# Patient Record
Sex: Male | Born: 1950 | Race: White | Hispanic: Yes | Marital: Married | Smoking: Former smoker
Health system: Southern US, Community
[De-identification: ages and names within clinical notes are randomized; demographics above are authoritative.]

## PROBLEM LIST (undated history)

## (undated) DIAGNOSIS — H269 Unspecified cataract: Secondary | ICD-10-CM

## (undated) DIAGNOSIS — G8929 Other chronic pain: Secondary | ICD-10-CM

## (undated) DIAGNOSIS — N471 Phimosis: Secondary | ICD-10-CM

## (undated) DIAGNOSIS — M549 Dorsalgia, unspecified: Secondary | ICD-10-CM

## (undated) DIAGNOSIS — E785 Hyperlipidemia, unspecified: Secondary | ICD-10-CM

## (undated) DIAGNOSIS — F32A Depression, unspecified: Secondary | ICD-10-CM

## (undated) DIAGNOSIS — M47816 Spondylosis without myelopathy or radiculopathy, lumbar region: Secondary | ICD-10-CM

## (undated) DIAGNOSIS — G43909 Migraine, unspecified, not intractable, without status migrainosus: Secondary | ICD-10-CM

## (undated) DIAGNOSIS — M961 Postlaminectomy syndrome, not elsewhere classified: Secondary | ICD-10-CM

## (undated) DIAGNOSIS — F329 Major depressive disorder, single episode, unspecified: Secondary | ICD-10-CM

## (undated) DIAGNOSIS — F419 Anxiety disorder, unspecified: Secondary | ICD-10-CM

## (undated) DIAGNOSIS — E349 Endocrine disorder, unspecified: Secondary | ICD-10-CM

## (undated) HISTORY — DX: Anxiety disorder, unspecified: F41.9

## (undated) HISTORY — DX: Unspecified cataract: H26.9

## (undated) HISTORY — DX: Depression, unspecified: F32.A

## (undated) HISTORY — DX: Postlaminectomy syndrome, not elsewhere classified: M96.1

---
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
## 1985-10-04 HISTORY — PX: LAMINECTOMY: SHX219

## 2010-01-05 DIAGNOSIS — R5381 Other malaise: Secondary | ICD-10-CM | POA: Insufficient documentation

## 2010-05-08 DIAGNOSIS — G43909 Migraine, unspecified, not intractable, without status migrainosus: Secondary | ICD-10-CM | POA: Insufficient documentation

## 2010-07-17 ENCOUNTER — Ambulatory Visit (HOSPITAL_COMMUNITY): Admission: RE | Admit: 2010-07-17 | Discharge: 2010-07-18 | Payer: Self-pay | Admitting: Surgery

## 2010-07-17 HISTORY — PX: OTHER SURGICAL HISTORY: SHX169

## 2010-11-30 DIAGNOSIS — F528 Other sexual dysfunction not due to a substance or known physiological condition: Secondary | ICD-10-CM | POA: Insufficient documentation

## 2010-11-30 DIAGNOSIS — F411 Generalized anxiety disorder: Secondary | ICD-10-CM | POA: Insufficient documentation

## 2010-11-30 DIAGNOSIS — M549 Dorsalgia, unspecified: Secondary | ICD-10-CM | POA: Insufficient documentation

## 2010-12-17 LAB — COMPREHENSIVE METABOLIC PANEL
ALT: 30 U/L (ref 0–53)
AST: 23 U/L (ref 0–37)
Albumin: 3.8 g/dL (ref 3.5–5.2)
Alkaline Phosphatase: 96 U/L (ref 39–117)
BUN: 11 mg/dL (ref 6–23)
CO2: 29 mEq/L (ref 19–32)
Calcium: 9.5 mg/dL (ref 8.4–10.5)
Chloride: 106 mEq/L (ref 96–112)
Creatinine, Ser: 0.93 mg/dL (ref 0.4–1.5)
GFR calc Af Amer: >60 mL/min (ref >60)
GFR calc non Af Amer: >60 mL/min (ref >60)
Glucose, Bld: 98 mg/dL (ref 70–99)
Potassium: 4.5 mEq/L (ref 3.5–5.1)
Sodium: 141 mEq/L (ref 135–145)
Total Bilirubin: 0.5 mg/dL (ref 0.3–1.2)
Total Protein: 7 g/dL (ref 6.0–8.3)

## 2010-12-17 LAB — CBC
HCT: 39 % (ref 39.0–52.0)
Hemoglobin: 12.3 g/dL — ABNORMAL LOW (ref 13.0–17.0)
MCH: 19.4 pg — ABNORMAL LOW (ref 26.0–34.0)
MCHC: 31.4 g/dL (ref 30.0–36.0)
MCV: 61.8 fL — ABNORMAL LOW (ref 78.0–100.0)
Platelets: 195 10*3/uL (ref 150–400)
RBC: 6.3 MIL/uL — ABNORMAL HIGH (ref 4.22–5.81)
RDW: 17.1 % — ABNORMAL HIGH (ref 11.5–15.5)

## 2010-12-17 LAB — PROTIME-INR
INR: 0.93 (ref 0.00–1.49)
Prothrombin Time: 12.7 seconds (ref 11.6–15.2)

## 2010-12-17 LAB — SURGICAL PCR SCREEN
MRSA, PCR: NEGATIVE
Staphylococcus aureus: POSITIVE — AB

## 2011-08-03 ENCOUNTER — Ambulatory Visit (HOSPITAL_BASED_OUTPATIENT_CLINIC_OR_DEPARTMENT_OTHER): Payer: BC Managed Care – PPO | Admitting: Physical Medicine & Rehabilitation

## 2011-08-03 ENCOUNTER — Encounter: Payer: BC Managed Care – PPO | Attending: Physical Medicine & Rehabilitation

## 2011-08-03 DIAGNOSIS — M545 Low back pain, unspecified: Secondary | ICD-10-CM | POA: Insufficient documentation

## 2011-08-03 DIAGNOSIS — M961 Postlaminectomy syndrome, not elsewhere classified: Secondary | ICD-10-CM | POA: Insufficient documentation

## 2011-08-03 NOTE — Consult Note (Addendum)
REASON FOR VISIT:  Chronic low back pain left greater than right side.  REASON FOR CONSULTATION:  As noted above.  CHIEF COMPLAINT:  As noted above.  HISTORY:  A 60 year old male who had radiculopathy involving the left lower extremity in 1987.  His left leg went numb, he had weakness in his left calf.  He underwent surgical resection and did well until about 3 years later when he developed increasing low back pain.  He had further workup, but no further surgery was deemed necessary.  He had corticosteroid pills.  He really did not have any injections.  He was started on tramadol and over the years has been increased to 50 mg 2 p.o. q.i.d.  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  He walks 3 mile as per day 3 days a week, and also plays golf once a week.  SOCIAL HISTORY:  Married, lives with his wife, drinks alcohol 2-3 drinks per week.  FAMILY HISTORY:  Noncontributory.  OTHER MEDICATIONS: 1. Lipitor 10 mg a day. 2. AndroGel 4 pumps daily.  PHYSICAL EXAMINATION:  VITAL SIGNS:  Blood pressure 159/83, pulse 12, respirations 14, O2 saturation 97% on room air. GENERAL:  No acute distress.  Mood and affect appropriate. MUSCULOSKELETAL:  His upper extremity strength is normal.  Lower extremity strength is normal.  He has decreased sensation in the left S1 greater than left L5 dermatomal distribution.  He has an absent left S1 reflex.  Otherwise, normal in the lower extremities.  His lumbar spine range of motion is normal.  Straight leg raising test is negative.  Gait is normal.  IMPRESSION: 1. Lumbar post laminectomy syndrome, L5-S1 laminectomy or diskectomy     not really shore, remote history no records from that time.  We     will get an x-ray to see exactly what procedure he had we could     also look at some adjacent level, disk space narrowing if present. 2. Pain management.  I agree that the tramadol 50 mg 2 p.o. q.i.d.     would be our best  bet in this regard.  There are other longer     release options but they only go to 300 mg once a day 3. In terms of exercise for his back pain, I have given him a fit back     workouts which would include some stretching and core strengthening     exercises.  I encouraged him to do these on a daily basis. 4. I will see him back in 6 months or sooner if he has any other type     of complaints. 5. Discussed with the patient and agrees with plan.     Erick Colace, M.D.    AEK/MedQ D:08/03/2011 10:10:50  T:08/03/2011 11:22:43  Job #:  [id number]  Electronically Signed by Claudette Laws M.D. on 08/03/2011 12:34:50 PM REASON FOR VISIT:  Chronic low back pain left greater than right side.  REASON FOR CONSULTATION:  As noted above.  CHIEF COMPLAINT:  As noted above.  HISTORY:  A 60 year old male who had radiculopathy involving the left lower extremity in 1987.  His left leg went numb, he had weakness in his left calf.  He underwent surgical resection and did well until about 3 years later when he developed increasing low back pain.  He had further workup, but no further surgery was deemed necessary.  He had corticosteroid pills.  He really did not have  any injections.  He was started on tramadol and over the years has been increased to 50 mg 2 p.o. q.i.d.  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  He walks 3 mile as per day 3 days a week, and also plays golf once a week.  SOCIAL HISTORY:  Married, lives with his wife, drinks alcohol 2-3 drinks per week.  FAMILY HISTORY:  Noncontributory.  OTHER MEDICATIONS: 1. Lipitor 10 mg a day. 2. AndroGel 4 pumps daily.  PHYSICAL EXAMINATION:  VITAL SIGNS:  Blood pressure 159/83, pulse 12, respirations 14, O2 saturation 97% on room air. GENERAL:  No acute distress.  Mood and affect appropriate. MUSCULOSKELETAL:  His upper extremity strength is normal.  Lower extremity strength is normal.  He has decreased  sensation in the left S1 greater than left L5 dermatomal distribution.  He has an absent left S1 reflex.  Otherwise, normal in the lower extremities.  His lumbar spine range of motion is normal.  Straight leg raising test is negative.  Gait is normal.  IMPRESSION: 1. Lumbar post laminectomy syndrome, L5-S1 laminectomy or diskectomy     not really shore, remote history no records from that time.  We     will get an x-ray to see exactly what procedure he had we could     also look at some adjacent level, disk space narrowing if present. 2. Pain management.  I agree that the tramadol 50 mg 2 p.o. q.i.d.     would be our best bet in this regard.  There are other longer     release options but they only go to 300 mg once a day 3. In terms of exercise for his back pain, I have given him a fit back     workouts which would include some stretching and core strengthening     exercises.  I encouraged him to do these on a daily basis. 4. I will see him back in 6 months or sooner if he has any other type     of complaints. 5. Discussed with the patient and agrees with plan.     Erick Colace, M.D. Electronically Signed    AEK/MedQ D:08/03/2011 10:10:50  T:08/03/2011 11:22:43  Job #:  [id number]

## 2011-09-30 DIAGNOSIS — E785 Hyperlipidemia, unspecified: Secondary | ICD-10-CM | POA: Insufficient documentation

## 2011-09-30 DIAGNOSIS — E349 Endocrine disorder, unspecified: Secondary | ICD-10-CM | POA: Insufficient documentation

## 2011-12-02 ENCOUNTER — Telehealth: Payer: Self-pay | Admitting: Physical Medicine & Rehabilitation

## 2011-12-02 NOTE — Telephone Encounter (Signed)
Patient would like to speak to Dr Wynn Banker personally about his healthcare.

## 2011-12-28 ENCOUNTER — Ambulatory Visit (HOSPITAL_COMMUNITY)
Admission: RE | Admit: 2011-12-28 | Discharge: 2011-12-28 | Disposition: A | Discharge status: Home / Self Care | Payer: BC Managed Care – PPO | Source: Ambulatory Visit | Attending: Physical Medicine & Rehabilitation | Admitting: Physical Medicine & Rehabilitation

## 2011-12-28 ENCOUNTER — Other Ambulatory Visit: Payer: Self-pay | Admitting: Physical Medicine & Rehabilitation

## 2011-12-28 DIAGNOSIS — M51379 Other intervertebral disc degeneration, lumbosacral region without mention of lumbar back pain or lower extremity pain: Secondary | ICD-10-CM | POA: Insufficient documentation

## 2011-12-28 DIAGNOSIS — M545 Low back pain: Secondary | ICD-10-CM

## 2011-12-28 DIAGNOSIS — M5137 Other intervertebral disc degeneration, lumbosacral region: Secondary | ICD-10-CM | POA: Insufficient documentation

## 2011-12-30 ENCOUNTER — Ambulatory Visit: Payer: BC Managed Care – PPO | Admitting: Physical Medicine & Rehabilitation

## 2012-01-03 ENCOUNTER — Ambulatory Visit: Payer: BC Managed Care – PPO

## 2012-01-03 ENCOUNTER — Encounter: Payer: BC Managed Care – PPO | Attending: Physical Medicine & Rehabilitation

## 2012-01-03 ENCOUNTER — Other Ambulatory Visit: Payer: Self-pay | Admitting: *Deleted

## 2012-01-03 ENCOUNTER — Encounter: Payer: Self-pay | Admitting: Physical Medicine & Rehabilitation

## 2012-01-03 ENCOUNTER — Ambulatory Visit (HOSPITAL_BASED_OUTPATIENT_CLINIC_OR_DEPARTMENT_OTHER): Payer: BC Managed Care – PPO | Admitting: Physical Medicine & Rehabilitation

## 2012-01-03 VITALS — BP 161/89 | HR 110 | Resp 16 | Ht 73.0 in | Wt 236.0 lb

## 2012-01-03 DIAGNOSIS — M47817 Spondylosis without myelopathy or radiculopathy, lumbosacral region: Secondary | ICD-10-CM

## 2012-01-03 DIAGNOSIS — M47816 Spondylosis without myelopathy or radiculopathy, lumbar region: Secondary | ICD-10-CM

## 2012-01-03 DIAGNOSIS — M961 Postlaminectomy syndrome, not elsewhere classified: Secondary | ICD-10-CM | POA: Insufficient documentation

## 2012-01-03 NOTE — Progress Notes (Signed)
  Subjective:    Patient ID: Zachary Everett, male    DOB: 20-Apr-1951, 61 y.o.   MRN: [id number]  HPI  No new problems today. Primarily back pain which worsens after he plays golf or stands in one place for long per time. We checked x-rays and I reviewed the results with the patient. He has a history of lumbar discectomy and at L5-S1 has a markedly narrowed disc space. There is no evidence of instrumentation. At L4-L5 there is a 3 mm anterolisthesis and some associated facet degenerative changes at that same level. Patient has had some type of back injections in the past which were not very helpful he does not remember what type they were however. He's not interested in any surgery at this time. Pain is well relieved by tramadol Pain Inventory Average Pain 3 Pain Right Now 3 My pain is sharp  In the last 24 hours, has pain interfered with the fo llowing? General activity 5 Relation with others 5 Enjoyment of life 6 What TIME of day is your pain at its worst? morning Sleep (in general) Poor  Pain is worse with: lifting Pain improves with: medication Relief from Meds: 9  Mobility how many minutes can you walk? 40 ability to climb steps?  yes do you drive?  yes Do you have any goals in this area?  yes  Function employed # of hrs/week 55  Neuro/Psych numbness  Prior Studies Any changes since last visit?  no  Physicians involved in your care Any changes since last visit?  no  Review of Systems  Constitutional: Negative.   HENT: Negative.   Eyes: Negative.   Respiratory: Negative.   Cardiovascular: Negative.   Gastrointestinal: Negative.   Genitourinary: Negative.   Musculoskeletal: Negative.   Skin: Negative.   Neurological: Positive for numbness.  Hematological: Negative.   Psychiatric/Behavioral: Negative.        Objective:   Physical Exam  Constitutional: He is oriented to person, place, and time. He appears well-developed and well-nourished.    Musculoskeletal:       Lumbar back: Normal.  Neurological: He is alert and oriented to person, place, and time. He has normal strength. No sensory deficit.  Reflex Scores:      Tricep reflexes are 2+ on the right side and 2+ on the left side.      Bicep reflexes are 2+ on the right side and 2+ on the left side.      Brachioradialis reflexes are 2+ on the right side and 2+ on the left side.      Patellar reflexes are 2+ on the right side and 2+ on the left side.      Achilles reflexes are 2+ on the right side and 2+ on the left side. Psychiatric: He has a normal mood and affect. His behavior is normal. Judgment and thought content normal.          Assessment & Plan:  1. 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 He has responded quite well to tramadol and has not had any progression in his pain. We did discuss his x-ray findings in great detail including looking at the films as well as looking at a spine model. 2. Lumbar spondylosis without evidence of myelopathy. Should his pain progressed I would favor trying facet injections. He has tried physical therapy in the past she is not interested in repeating at this point.

## 2012-01-03 NOTE — Patient Instructions (Signed)
Facet Block A facet block is an injection procedure used to numb nerves near a spinal joint (facet). The injection usually includes a medicine like Novacaine (anesthetic) and a steroid medicine (similar to cortisone). The injections are made directly into the facet joint of the back. They are used for patients with several types of neck or back pain problems (such as worsening arthritis or persistent pain after surgery) that have not been helped with anti-inflammatory medications, exercise programs, physical therapy, and other forms of pain management. Multiple injections may be needed depending on how many joints are involved.  A facet block procedure can be helpful with diagnosis as well as providing therapeutic pain relief. One of three things may happen after the procedure:  The pain does not go away. This can mean that the pain is probably not coming from blocked facet joints. This information is helpful with diagnosis.   The pain goes away and stays away for a few hours but the original pain comes back and does not get better again. This information is also helpful with diagnosis. It can mean that pain is probably coming from the joints; but the steroid was not helpful for longer term pain control.   The pain goes away after the block, then returns later that day, and then gets better again over the next few days. This can mean that the block was helpful for pain control and the steroid had a longer lasting effect.  If there is good, lasting benefit from the injections, the block may be repeated from 3 to 5 times. If there is good relief but it is only of short-term benefit, other procedures (such as radiofrequency lesioning) may be considered.  Note: The procedure cannot be performed if you have an active infection, a lesion on or near the area of injection, flu, cold, fever, very high blood pressure or if you are on blood thinners. Please make your doctor aware of any of these conditions. This is  for your safety!  LET YOUR CAREGIVER KNOW ABOUT:   Allergies.   Medications taken including herbs, eye drops, over the counter medications, and creams   Use of steroids (by mouth or creams).   Possible pregnancy, if applicable.   Previous problems with anesthetics or Novocaine.   History of blood clots.   History of bleeding or blood problems.   Previous surgery, particularly of the neck and/or back   Other health problems.  RISKS AND COMPLICATIONS These are very uncommon but include:  Bleeding.   Injury to a nerve near the injection site.   Weakness or numbness in areas controlled by nerves near the injection site.   Infection.   Pain at the site of the injection.   Temporary fluid retention in those who are prone to this problem.   Allergic reaction to anesthetics or medicines used during the procedure.  Diabetics may have a temporary increase in their blood sugar after any surgical procedure, especially if steroids are used. Stinging/burning of the numbing medicine is the most uncomfortable part of the procedure; however every person's response to any procedure is individual.  BEFORE THE PROCEDURE   Your caregiver will provide instructions about stopping any medication before the procedure.   Unless advised otherwise, if the injections are in your neck, you may take your medications as usual with a sip of water but do not eat or drink for 6 hours before the procedure.   Unless advised otherwise, you may eat, drink and take your medications as   usual on the day of the procedure (both before and after) if the injections are to be in your lower back.   There is no other specific preparation necessary unless advised otherwise.  PROCEDURE After checking your blood pressure, the procedure will be done in the x-ray (fluoroscopy) room while lying on your stomach. For procedures in the neck, an intravenous line is usually started. The back is then cleansed with an antiseptic  soap. Sterile drapes are placed in this area. The skin is numbed with a local anesthetic. This is felt as a stinging or burning sensation. Using x-ray guidance, needles are then advanced to the appropriate locations. Once the needles are in the proper location, the anesthetic and steroid is injected through the needles and the needles are removed. The skin is then cleansed and bandages are applied. Blood pressure will be checked again, and you will be discharged to leave with your ride after your caregiver says it is okay to go.  AFTER THE PROCEDURE  You may not drive for the remainder of the day after your procedure. An adult must be present to drive you home or to go with you in a taxi or on public transportation. The procedure will be canceled if you do not have a responsible adult with you! This is for your safety.  HOME CARE INSTRUCTIONS   The bandages noted above can be removed on the morning after the procedure.   Resume medications according to your caregiver's instructions.   No heat is to be used near or over the injected area(s) for the remainder of the day.   No tub bath or soaking in water (such as a pool, jacuzzi, etc.) for the remainder of the day.   Some local tenderness may be experienced for a couple of days after the injection. Using an ice pack three or four times a day will help this.   Keep track of the amount of pain relief as well as how long the pain relief lasted.  SEEK MEDICAL CARE IF:   There is drainage from the injection site.   Pain is not controlled with medications prescribed.   There is significant bleeding or swelling.  SEEK IMMEDIATE MEDICAL CARE IF:   You develop a fever of 101 F (38.3 C) or greater.   Worsening pain, swelling, and/or red streaking develops in the skin around the injection site.   Severe pain develops and cannot be controlled with medications prescribed.   You develop any headache, stiff neck, nausea, vomiting, or your eyes become  very sensitive to light.   Weakness or paralysis develops in arms or legs not present before the procedure.   You develop difficulty urinating or difficulty breathing.  Document Released: 02/09/2007 Document Revised: 09/09/2011 Document Reviewed: 01/30/2009 [employer] Patient Information 2012 [employer]. 

## 2012-01-04 ENCOUNTER — Encounter: Payer: Self-pay | Admitting: Physical Medicine & Rehabilitation

## 2012-01-17 ENCOUNTER — Other Ambulatory Visit: Payer: Self-pay | Admitting: *Deleted

## 2012-01-17 MED ORDER — TRAMADOL HCL 50 MG PO TABS: 100.0000 mg | ORAL_TABLET | Freq: Four times a day (QID) | ORAL | Status: DC

## 2012-02-23 ENCOUNTER — Other Ambulatory Visit: Payer: Self-pay | Admitting: Urology

## 2012-02-24 ENCOUNTER — Encounter (HOSPITAL_BASED_OUTPATIENT_CLINIC_OR_DEPARTMENT_OTHER): Payer: Self-pay | Admitting: *Deleted

## 2012-02-24 DIAGNOSIS — IMO0001 Reserved for inherently not codable concepts without codable children: Secondary | ICD-10-CM | POA: Insufficient documentation

## 2012-02-24 DIAGNOSIS — N471 Phimosis: Secondary | ICD-10-CM

## 2012-02-24 NOTE — Anesthesia Preprocedure Evaluation (Addendum)
Anesthesia Evaluation  Patient identified by MRN, date of birth, ID band Patient awake    Reviewed: Allergy & Precautions, H&P , NPO status , Patient's Chart, lab work & pertinent test results  Airway Mallampati: II TM Distance: >3 FB Neck ROM: Full    Dental No notable dental hx.    Pulmonary neg pulmonary ROS, former smoker breath sounds clear to auscultation  Pulmonary exam normal       Cardiovascular Exercise Tolerance: Good negative cardio ROS  Rhythm:Regular Rate:Normal     Neuro/Psych  Headaches, Migraine now. negative psych ROS   GI/Hepatic negative GI ROS, Neg liver ROS,   Endo/Other  negative endocrine ROS  Renal/GU negative Renal ROS  negative genitourinary   Musculoskeletal negative musculoskeletal ROS (+)   Abdominal (+) + obese,   Peds negative pediatric ROS (+)  Hematology negative hematology ROS (+)   Anesthesia Other Findings   Reproductive/Obstetrics negative OB ROS                         Anesthesia Physical Anesthesia Plan  ASA: I  Anesthesia Plan: General   Post-op Pain Management:    Induction: Intravenous  Airway Management Planned: LMA  Additional Equipment:   Intra-op Plan:   Post-operative Plan: Extubation in OR  Informed Consent: I have reviewed the patients History and Physical, chart, labs and discussed the procedure including the risks, benefits and alternatives for the proposed anesthesia with the patient or authorized representative who has indicated his/her understanding and acceptance.   Dental advisory given  Plan Discussed with: CRNA  Anesthesia Plan Comments:         Anesthesia Quick Evaluation

## 2012-02-24 NOTE — Progress Notes (Signed)
NPO AFTER MN. ARRIVES AT 0600. NEEDS CBC AND PT/INR. WILL TAKE ULTRAM AM OF SURG. W/ SIPS OF WATER.

## 2012-02-24 NOTE — H&P (Signed)
History of Present Illness      Zachary Everett is a 61 year old male patient who is self referred for phimosis. He reports this condition began about 3 or 4 weeks ago. He's never had any previous difficulty retracting his foreskin but now finds it very painful to retract the foreskin and when it is retracted it is uncomfortable and feels like there is a rubber band around the penis. He has no history of diabetes. He's not seen any discharge and has not seen any redness or inflammation. He has noted some whitish discoloration of the foreskin distally. As far as his voiding goes he does have some mild voiding symptoms consisting of nocturia and some mild hesitancy. His condition would be considered of moderate severity with no modifying factors or other associated signs and symptoms.   Past Medical History Problems  1. History of  Arthritis V13.4  Surgical History Problems  1. History of  Back Surgery 2. History of  Umbilical Hernia Repair Bilateral  Current Meds 1. AndroGel Pump 20.25 MG/ACT (1.62%) Transdermal Gel; Therapy: 27Dec2012 to 2. Lipitor 20 MG Oral Tablet; Therapy: (Recorded:22May2013) to 3. Ultram TABS; Therapy: (Recorded:22May2013) to  Allergies Medication  1. Penicillins  Family History Problems  1. Family history of  No Significant Family History  Social History Problems    Being A Social Drinker   Caffeine Use   Former Smoker V15.82 quit in 1987 1 ppdx15 years ago   Marital History - Currently Married  Review of Systems Genitourinary, constitutional, skin, eye, otolaryngeal, hematologic/lymphatic, cardiovascular, pulmonary, endocrine, musculoskeletal, gastrointestinal, neurological and psychiatric system(s) were reviewed and pertinent findings if present are noted.  Genitourinary: dysuria, nocturia, difficulty starting the urinary stream, weak urinary stream, urinary stream starts and stops and penile pain.  Musculoskeletal: back pain.  Neurological: headache.    Vitals Vital Signs BMI Calculated: 30.62 BSA Calculated: 2.28 Height: 6 ft 1 in Weight: 231 lb  Blood Pressure: 139 / 88 Heart Rate: 97  Physical Exam Constitutional: Well nourished and well developed . No acute distress.  ENT:. The ears and nose are normal in appearance.  Neck: The appearance of the neck is normal and no neck mass is present.  Pulmonary: No respiratory distress and normal respiratory rhythm and effort.  Cardiovascular: Heart rate and rhythm are normal . No peripheral edema.  Abdomen: The abdomen is soft and nontender. No masses are palpated. No CVA tenderness. No hernias are palpable. No hepatosplenomegaly noted.  Rectal: Rectal exam demonstrates normal sphincter tone, no tenderness and no masses. Estimated prostate size is 1+. The prostate has no nodularity and is not tender. The left seminal vesicle is nonpalpable. The right seminal vesicle is nonpalpable. The perineum is normal on inspection.  Genitourinary: Examination of the penis demonstrates phimosis, but no discharge, no masses, no lesions and a normal meatus. The penis is uncircumcised. The scrotum is without lesions. The right epididymis is palpably normal and non-tender. The left epididymis is palpably normal and non-tender. The right testis is non-tender and without masses. The left testis is non-tender and without masses.  Lymphatics: The femoral and inguinal nodes are not enlarged or tender.  Skin: Normal skin turgor, no visible rash and no visible skin lesions.  Neuro/Psych:. Mood and affect are appropriate.    Results/Data Urine  22May2013  COLOR YELLOW   APPEARANCE CLEAR   SPECIFIC GRAVITY 1.020   pH 6.0   GLUCOSE NEG mg/dL  BILIRUBIN NEG   KETONE TRACE mg/dL  BLOOD TRACE   PROTEIN NEG  mg/dL  UROBILINOGEN 1 mg/dL  NITRITE NEG   LEUKOCYTE ESTERASE NEG   SQUAMOUS EPITHELIAL/HPF RARE   WBC NONE SEEN WBC/hpf  RBC 0-3 RBC/hpf  BACTERIA NONE SEEN   CRYSTALS NONE SEEN   CASTS NONE SEEN     Assessment Assessed  1. Phimosis 605 2. Benign Prostatic Hypertrophy With Urinary Obstruction 600.01   He has what appears to be early BXO involving the foreskin but none involving the glans or meatus. We therefore discussed conservative therapy with topical steroids. We also discussed surgical correction with circumcision. He wanted to consider circumcision I therefore have discussed the procedure with him in detail including its risks and complications. He understands and has elected to proceed.  His prostate is smooth and symmetric with no nodularity or induration. He has not had an annual physical in 2 years and requested that I give him the name of a family practitioner saw a gave him Dr. Tenna Delaine name.   Plan    He is scheduled for a circumcision.

## 2012-02-25 ENCOUNTER — Encounter (HOSPITAL_BASED_OUTPATIENT_CLINIC_OR_DEPARTMENT_OTHER): Payer: Self-pay | Admitting: Anesthesiology

## 2012-02-25 ENCOUNTER — Ambulatory Visit (HOSPITAL_BASED_OUTPATIENT_CLINIC_OR_DEPARTMENT_OTHER): Payer: BC Managed Care – PPO | Admitting: Anesthesiology

## 2012-02-25 ENCOUNTER — Ambulatory Visit (HOSPITAL_BASED_OUTPATIENT_CLINIC_OR_DEPARTMENT_OTHER)
Admission: RE | Admit: 2012-02-25 | Discharge: 2012-02-25 | Disposition: A | Discharge status: Home / Self Care | Payer: BC Managed Care – PPO | Source: Ambulatory Visit | Attending: Urology | Admitting: Urology

## 2012-02-25 ENCOUNTER — Encounter (HOSPITAL_BASED_OUTPATIENT_CLINIC_OR_DEPARTMENT_OTHER)
Admission: RE | Disposition: A | Discharge status: Home / Self Care | Payer: Self-pay | Source: Ambulatory Visit | Attending: Urology

## 2012-02-25 ENCOUNTER — Encounter (HOSPITAL_BASED_OUTPATIENT_CLINIC_OR_DEPARTMENT_OTHER): Payer: Self-pay | Admitting: *Deleted

## 2012-02-25 DIAGNOSIS — N478 Other disorders of prepuce: Secondary | ICD-10-CM | POA: Insufficient documentation

## 2012-02-25 DIAGNOSIS — N4889 Other specified disorders of penis: Secondary | ICD-10-CM | POA: Insufficient documentation

## 2012-02-25 DIAGNOSIS — N471 Phimosis: Secondary | ICD-10-CM

## 2012-02-25 HISTORY — DX: Hyperlipidemia, unspecified: E78.5

## 2012-02-25 HISTORY — DX: Endocrine disorder, unspecified: E34.9

## 2012-02-25 HISTORY — DX: Spondylosis without myelopathy or radiculopathy, lumbar region: M47.816

## 2012-02-25 HISTORY — DX: Dorsalgia, unspecified: M54.9

## 2012-02-25 HISTORY — DX: Migraine, unspecified, not intractable, without status migrainosus: G43.909

## 2012-02-25 HISTORY — PX: CIRCUMCISION: SHX1350

## 2012-02-25 HISTORY — DX: Phimosis: N47.1

## 2012-02-25 HISTORY — DX: Other chronic pain: G89.29

## 2012-02-25 LAB — CBC
HCT: 42.1 % (ref 39.0–52.0)
Hemoglobin: 13.3 g/dL (ref 13.0–17.0)
MCHC: 31.6 g/dL (ref 30.0–36.0)
RDW: 17.4 % — ABNORMAL HIGH (ref 11.5–15.5)
WBC: 7.7 10*3/uL (ref 4.0–10.5)

## 2012-02-25 LAB — PROTIME-INR
INR: 0.91 (ref 0.00–1.49)
Prothrombin Time: 12.4 seconds (ref 11.6–15.2)

## 2012-02-25 SURGERY — CIRCUMCISION, ADULT
Anesthesia: General | Site: Penis | Wound class: Clean

## 2012-02-25 MED ORDER — CEFAZOLIN SODIUM 1-5 GM-% IV SOLN: 1.0000 g | INTRAVENOUS | Status: DC

## 2012-02-25 MED ORDER — FENTANYL CITRATE 0.05 MG/ML IJ SOLN: 25.0000 ug | INTRAMUSCULAR | Status: DC | PRN

## 2012-02-25 MED ORDER — LACTATED RINGERS IV SOLN
INTRAVENOUS | Status: DC
  Administered 2012-02-25: 100 mL/h via INTRAVENOUS

## 2012-02-25 MED ORDER — PROMETHAZINE HCL 25 MG/ML IJ SOLN: 6.2500 mg | INTRAMUSCULAR | Status: DC | PRN

## 2012-02-25 MED ORDER — MIDAZOLAM HCL 5 MG/5ML IJ SOLN
INTRAMUSCULAR | Status: DC | PRN
  Administered 2012-02-25: 2 mg via INTRAVENOUS

## 2012-02-25 MED ORDER — ONDANSETRON HCL 4 MG/2ML IJ SOLN
INTRAMUSCULAR | Status: DC | PRN
  Administered 2012-02-25: 4 mg via INTRAVENOUS

## 2012-02-25 MED ORDER — LIDOCAINE HCL (CARDIAC) 20 MG/ML IV SOLN
INTRAVENOUS | Status: DC | PRN
  Administered 2012-02-25: 80 mg via INTRAVENOUS

## 2012-02-25 MED ORDER — PROPOFOL 10 MG/ML IV EMUL
INTRAVENOUS | Status: DC | PRN
  Administered 2012-02-25: 200 mg via INTRAVENOUS

## 2012-02-25 MED ORDER — DEXAMETHASONE SODIUM PHOSPHATE 4 MG/ML IJ SOLN
INTRAMUSCULAR | Status: DC | PRN
  Administered 2012-02-25: 8 mg via INTRAVENOUS

## 2012-02-25 MED ORDER — BACITRACIN-NEOMYCIN-POLYMYXIN 400-5-5000 EX OINT
TOPICAL_OINTMENT | CUTANEOUS | Status: DC | PRN
  Administered 2012-02-25: 1 via TOPICAL

## 2012-02-25 MED ORDER — CEFAZOLIN SODIUM-DEXTROSE 2-3 GM-% IV SOLR
2.0000 g | INTRAVENOUS | Status: AC
  Administered 2012-02-25: 2 g via INTRAVENOUS

## 2012-02-25 MED ORDER — BUPIVACAINE HCL (PF) 0.25 % IJ SOLN
INTRAMUSCULAR | Status: DC | PRN
  Administered 2012-02-25: 20 mL

## 2012-02-25 MED ORDER — OXYCODONE-ACETAMINOPHEN 10-325 MG PO TABS: 1.0000 | ORAL_TABLET | ORAL | Status: AC | PRN

## 2012-02-25 MED ORDER — FENTANYL CITRATE 0.05 MG/ML IJ SOLN
INTRAMUSCULAR | Status: DC | PRN
  Administered 2012-02-25 (×2): 50 ug via INTRAVENOUS
  Administered 2012-02-25: 25 ug via INTRAVENOUS

## 2012-02-25 SURGICAL SUPPLY — 31 items
BANDAGE CO FLEX L/F 1IN X 5YD (GAUZE/BANDAGES/DRESSINGS) ×1 IMPLANT
BANDAGE CO FLEX L/F 2IN X 5YD (GAUZE/BANDAGES/DRESSINGS) IMPLANT
BANDAGE CONFORM 3  STR LF (GAUZE/BANDAGES/DRESSINGS) ×1 IMPLANT
BLADE SURG 15 STRL LF DISP TIS (BLADE) ×1 IMPLANT
BLADE SURG 15 STRL SS (BLADE) ×2
BLADE SURG ROTATE 9660 (MISCELLANEOUS) IMPLANT
BNDG COHESIVE 1X5 TAN STRL LF (GAUZE/BANDAGES/DRESSINGS) ×1 IMPLANT
CLOTH BEACON ORANGE TIMEOUT ST (SAFETY) ×2 IMPLANT
COVER MAYO STAND STRL (DRAPES) ×2 IMPLANT
COVER TABLE BACK 60X90 (DRAPES) ×2 IMPLANT
DRAPE PED LAPAROTOMY (DRAPES) ×2 IMPLANT
ELECT REM PT RETURN 9FT ADLT (ELECTROSURGICAL) ×2
ELECTRODE REM PT RTRN 9FT ADLT (ELECTROSURGICAL) ×1 IMPLANT
GLOVE BIO SURGEON STRL SZ7 (GLOVE) ×1 IMPLANT
GLOVE BIO SURGEON STRL SZ8 (GLOVE) ×2 IMPLANT
GLOVE INDICATOR 7.0 STRL GRN (GLOVE) ×4 IMPLANT
GOWN PREVENTION PLUS LG XLONG (DISPOSABLE) ×2 IMPLANT
GOWN STRL REIN XL XLG (GOWN DISPOSABLE) ×2 IMPLANT
GOWN SURGICAL LARGE (GOWNS) ×1 IMPLANT
IV NS IRRIG 3000ML ARTHROMATIC (IV SOLUTION) IMPLANT
NEEDLE HYPO 22GX1.5 SAFETY (NEEDLE) ×2 IMPLANT
NS IRRIG 500ML POUR BTL (IV SOLUTION) ×1 IMPLANT
PACK BASIN DAY SURGERY FS (CUSTOM PROCEDURE TRAY) ×2 IMPLANT
PENCIL BUTTON HOLSTER BLD 10FT (ELECTRODE) ×2 IMPLANT
SUT CHROMIC 3 0 SH 27 (SUTURE) ×4 IMPLANT
SUT CHROMIC 4 0 RB 1X27 (SUTURE) ×1 IMPLANT
SYR CONTROL 10ML LL (SYRINGE) ×2 IMPLANT
TOWEL OR 17X24 6PK STRL BLUE (TOWEL DISPOSABLE) ×4 IMPLANT
TRAY DSU PREP LF (CUSTOM PROCEDURE TRAY) ×2 IMPLANT
WATER STERILE IRR 3000ML UROMA (IV SOLUTION) IMPLANT
WATER STERILE IRR 500ML POUR (IV SOLUTION) ×1 IMPLANT

## 2012-02-25 NOTE — Transfer of Care (Signed)
Immediate Anesthesia Transfer of Care Note  Patient: Zachary Everett  Procedure(s) Performed: Procedure(s) (LRB): CIRCUMCISION ADULT (N/A)  Patient Location: PACU  Anesthesia Type: General  Level of Consciousness: awake, oriented, sedated and patient cooperative  Airway & Oxygen Therapy: Patient Spontanous Breathing and Patient connected to face mask oxygen  Post-op Assessment: Report given to PACU RN and Post -op Vital signs reviewed and stable  Post vital signs: Reviewed and stable  Complications: No apparent anesthesia complications

## 2012-02-25 NOTE — Op Note (Signed)
PATIENT:  Zachary Everett  PRE-OPERATIVE DIAGNOSIS:  1. Phimosis 2. 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 Release and repair of tethered frenulum 2. Circumcision  SURGEON:  Garnett Farm  INDICATION: Mr. Zachary Everett is a 61 year old male who is a nondiabetic. He recently began to have difficulty retracting his foreskin. It became painful to retract and then resulted in cracking and splitting of the foreskin. When he then was able to get the foreskin back over the glans he said it felt like a rubber band been placed around his penis. This has become progressively worse. 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 We discussed the treatment options and he has elected to proceed with surgical correction.  NESTHESIA:  General  EBL:  Minimal  DRAINS:  none  LOCAL MEDICATIONS USED:   1/4 percent plain Marcaine   SPECIMEN:   Foreskin  DISPOSITION OF SPECIMEN:  PATHOLOGY  Description of procedure: after informed consent the patient was taken to the major operating room and placed on the table. He was administered general anesthesia as well as intravenous antibiotics. He then underwent sterile prepping and draping. An official timeout was then performed.  A dorsal penile block was performed by injecting the Marcaine at the base of the penis in the standard fashion using 10 cc initially. At the end of the procedure I used a second 10 cc to perform a ring block at the base of the penis.  I retracted his foreskin and cleansed the glans and inner preputial skin with Betadine. I noted after retracting the foreskin very significant tethering of the frenula region. I attempted to pass a hemostat beneath the tethered band but was unsuccessful indicating that this was a tethering band as opposed to a adhesion.  I therefore incised the band somewhat proximally and this retracted up onto the glans. I then made a circumcising incision circumferentially approximately 4 mm from the glans. I then replaced the foreskin in its normal anatomic position and marked the position of his corona and incised along this marking. The redundant foreskin was then excised and bleeding points on the shaft of the penis were cauterized with electrocautery.  I first repaired the tethered frenulum by approximating the frenular skin edges that had retracted onto the glans with interrupted 4-0 chromic sutures. I then reapproximated the skin edges by placing a suture of 3-0 chromic at the 6:00 position in the shaft skin and using a U stitch at the 6:00 position and a second 3-0 chromic was placed at the 12:00 position. I then approximated the skin edges with the 3-0 chromic in a running fashion. I then applied Neosporin to the incision and folded 4 x 4's were applied gently and loosely around the penis. Coban was then used to secure this and the patient was awakened and taken to the recovery room in stable and satisfactory condition. He tolerated the procedure well no intraoperative complications. Needle sponge and instrument counts were correct.    PLAN OF CARE: Discharge to home after PACU  PATIENT DISPOSITION:  PACU - hemodynamically stable.

## 2012-02-25 NOTE — Anesthesia Postprocedure Evaluation (Signed)
  Anesthesia Post-op Note  Patient: Zachary Everett  Procedure(s) Performed: Procedure(s) (LRB): CIRCUMCISION ADULT (N/A)  Patient Location: PACU  Anesthesia Type: General  Level of Consciousness: awake and alert   Airway and Oxygen Therapy: Patient Spontanous Breathing  Post-op Pain: mild  Post-op Assessment: Post-op Vital signs reviewed, Patient's Cardiovascular Status Stable, Respiratory Function Stable, Patent Airway and No signs of Nausea or vomiting  Post-op Vital Signs: stable  Complications: No apparent anesthesia complications

## 2012-02-25 NOTE — Discharge Instructions (Addendum)
Postoperative instructions for circumcision ° °Wound: ° °In most cases your incision will have absorbable sutures that run along the course of your incision and will dissolve within the first 10-20 days. Some will fall out even earlier. Expect some redness as the sutures dissolved but this should occur only around the sutures. If there is generalized redness, especially with increasing pain or swelling, let us know. The penis will very likely get "black and blue" as the blood in the tissues spread. Sometimes the whole penis will turn colors. The black and blue is followed by a yellow and brown color. In time, all the discoloration will go away. ° °Diet: ° °You may return to your normal diet within 24 hours following your surgery. You may note some mild nausea and possibly vomiting the first 6-8 hours following surgery. This is usually due to the side effects of anesthesia, and will disappear quite soon. I would suggest clear liquids and a very light meal the first evening following your surgery. ° °Activity: ° °Your physical activity should be restricted the first 48 hours. During that time you should remain relatively inactive, moving about only when necessary. During the first 7-10 days following surgery he should avoid lifting any heavy objects (anything greater than 15 pounds), and avoid strenuous exercise. If you work, ask us specifically about your restrictions, both for work and home. We will write a note to your employer if needed. ° °Ice packs can be placed on and off over the penis for the first 48 hours to help relieve the pain and keep the swelling down. Frozen peas or corn in a ZipLock bag can be frozen, used and re-frozen. Fifteen minutes on and 15 minutes off is a reasonable schedule.  ° °Hygiene: ° °You may shower 48 hours after your surgery. Tub bathing should be restricted until the seventh day. ° °Medication: ° °You will be sent home with some type of pain medication. In many cases you will be  sent home with a narcotic pain pill (Vicodin or Tylox). If the pain is not too bad, you may take either Tylenol (acetaminophen) or Advil (ibuprofen) which contain no narcotic agents, and might be tolerated a little better, with fewer side effects. If the pain medication you are sent home with does not control the pain, you will have to let us know. Some narcotic pain medications cannot be given or refilled by a phone call to a pharmacy. ° °Problems you should report to us: ° °· Fever of 101.0 degrees Fahrenheit or greater. °· Moderate or severe swelling under the skin incision or involving the scrotum. °· Drug reaction such as hives, a rash, nausea or vomiting.  ° ° °Post Anesthesia Home Care Instructions ° °Activity: °Get plenty of rest for the remainder of the day. A responsible adult should stay with you for 24 hours following the procedure.  °For the next 24 hours, DO NOT: °-Drive a car °-Operate machinery °-Drink alcoholic beverages °-Take any medication unless instructed by your physician °-Make any legal decisions or sign important papers. ° °Meals: °Start with liquid foods such as gelatin or soup. Progress to regular foods as tolerated. Avoid greasy, spicy, heavy foods. If nausea and/or vomiting occur, drink only clear liquids until the nausea and/or vomiting subsides. Call your physician if vomiting continues. ° °Special Instructions/Symptoms: °Your throat may feel dry or sore from the anesthesia or the breathing tube placed in your throat during surgery. If this causes discomfort, gargle with warm salt water. The discomfort   should disappear within 24 hours. ° °

## 2012-02-25 NOTE — Interval H&P Note (Signed)
History and Physical Interval Note:  02/25/2012 7:13 AM  Zachary Everett  has presented today for surgery, with the diagnosis of Phimosis  The various methods of treatment have been discussed with the patient and family. After consideration of risks, benefits and other options for treatment, the patient has consented to  Procedure(s) (LRB): CIRCUMCISION ADULT (N/A) as a surgical intervention .  The patients' history has been reviewed, patient examined, no change in status, stable for surgery.  I have reviewed the patients' chart and labs.  Questions were answered to the patient's satisfaction.     Garnett Farm

## 2012-02-25 NOTE — Anesthesia Procedure Notes (Signed)
Procedure Name: LMA Insertion Date/Time: 02/25/2012 7:32 AM Performed by: Renella Cunas D Pre-anesthesia Checklist: Patient identified, Emergency Drugs available, Suction available and Patient being monitored Patient Re-evaluated:Patient Re-evaluated prior to inductionOxygen Delivery Method: Circle System Utilized Preoxygenation: Pre-oxygenation with 100% oxygen Intubation Type: IV induction Ventilation: Mask ventilation without difficulty LMA: LMA inserted LMA Size: 4.0 Number of attempts: 1 Airway Equipment and Method: bite block Placement Confirmation: positive ETCO2 Tube secured with: Tape Dental Injury: Teeth and Oropharynx as per pre-operative assessment

## 2012-02-29 ENCOUNTER — Encounter (HOSPITAL_BASED_OUTPATIENT_CLINIC_OR_DEPARTMENT_OTHER): Payer: Self-pay | Admitting: Urology

## 2012-03-17 ENCOUNTER — Other Ambulatory Visit: Payer: Self-pay | Admitting: Physical Medicine & Rehabilitation

## 2012-05-19 ENCOUNTER — Other Ambulatory Visit: Payer: Self-pay | Admitting: Physical Medicine & Rehabilitation

## 2012-07-04 ENCOUNTER — Encounter: Payer: BC Managed Care – PPO | Attending: Physical Medicine & Rehabilitation

## 2012-07-04 ENCOUNTER — Ambulatory Visit: Payer: BC Managed Care – PPO | Admitting: Physical Medicine & Rehabilitation

## 2012-07-04 DIAGNOSIS — M961 Postlaminectomy syndrome, not elsewhere classified: Secondary | ICD-10-CM | POA: Insufficient documentation

## 2012-07-04 DIAGNOSIS — M47817 Spondylosis without myelopathy or radiculopathy, lumbosacral region: Secondary | ICD-10-CM | POA: Insufficient documentation

## 2012-07-04 DIAGNOSIS — G8928 Other chronic postprocedural pain: Secondary | ICD-10-CM | POA: Insufficient documentation

## 2012-07-04 DIAGNOSIS — M7710 Lateral epicondylitis, unspecified elbow: Secondary | ICD-10-CM | POA: Insufficient documentation

## 2012-07-11 ENCOUNTER — Other Ambulatory Visit: Payer: Self-pay | Admitting: Physical Medicine & Rehabilitation

## 2012-07-11 ENCOUNTER — Encounter: Payer: Self-pay | Admitting: Physical Medicine & Rehabilitation

## 2012-07-11 ENCOUNTER — Ambulatory Visit (HOSPITAL_BASED_OUTPATIENT_CLINIC_OR_DEPARTMENT_OTHER): Payer: BC Managed Care – PPO | Admitting: Physical Medicine & Rehabilitation

## 2012-07-11 VITALS — BP 141/87 | HR 99 | Resp 14 | Ht 73.0 in | Wt 233.0 lb

## 2012-07-11 DIAGNOSIS — M7711 Lateral epicondylitis, right elbow: Secondary | ICD-10-CM

## 2012-07-11 DIAGNOSIS — M47817 Spondylosis without myelopathy or radiculopathy, lumbosacral region: Secondary | ICD-10-CM

## 2012-07-11 DIAGNOSIS — M7710 Lateral epicondylitis, unspecified elbow: Secondary | ICD-10-CM

## 2012-07-11 DIAGNOSIS — M961 Postlaminectomy syndrome, not elsewhere classified: Secondary | ICD-10-CM

## 2012-07-11 DIAGNOSIS — M47816 Spondylosis without myelopathy or radiculopathy, lumbar region: Secondary | ICD-10-CM

## 2012-07-11 MED ORDER — DICLOFENAC SODIUM 1 % TD GEL: 2.0000 g | Freq: Four times a day (QID) | TRANSDERMAL | Status: AC

## 2012-07-11 NOTE — Progress Notes (Signed)
  Subjective:    Patient ID: Zachary Everett, male    DOB: Jul 29, 1951, 61 y.o.   MRN: [id number]  HPI No new problems today. Primarily back pain which worsens after he plays golf or stands in one place for long per time. We checked x-rays and I reviewed the results with the patient. He has a history of lumbar discectomy and at L5-S1 has a markedly narrowed disc space. There is no evidence of instrumentation. At L4-L5 there is a 3 mm anterolisthesis and some associated facet degenerative changes at that same level. Patient has had some type of back injections in the past which were not very helpful he does not remember what type they were however. He's not interested in any surgery at this time. Pain is well relieved by tramadol 2 tablets QID  Golf 18 holes, driving range once a week Rides in cart   Review of Systems     Objective:   Physical Exam  Musculoskeletal:       Right elbow: He exhibits normal range of motion, no swelling, no effusion, no deformity and no laceration. tenderness found. Lateral epicondyle tenderness noted.       Pain with resisted wrist extension    Constitutional: He is oriented to person, place, and time. He appears well-developed and well-nourished.  Musculoskeletal:  Lumbar back: Normal.  Neurological: He is alert and oriented to person, place, and time. He has normal strength. No sensory deficit.  Reflex Scores:  Tricep reflexes are 2+ on the right side and 2+ on the left side.  Bicep reflexes are 2+ on the right side and 2+ on the left side.  Brachioradialis reflexes are 2+ on the right side and 2+ on the left side.  Patellar reflexes are 2+ on the right side and 2+ on the left side.  Achilles reflexes are 2+ on the right side and 0 on the left side. Psychiatric: He has a normal mood and affect. His behavior is normal. Judgment and thought content normal.        Assessment & Plan:  1. 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  Chronic S1 radiculopathyHe has responded quite well to tramadol and has not had any progression in his pain. We did discuss his x-ray findings in great detail including looking at the films as well as looking at a spine model.  2. Lumbar spondylosis without evidence of myelopathy. Should his pain progressed I would favor trying facet injections. He has tried physical therapy in the past she is not interested in repeating at this point 3. Right lateral epicondylitis associated with golf. Went over stretching as well as strengthening exercises. Prescribing Voltaren gel. Went over anatomy counterforce bracing

## 2012-07-11 NOTE — Patient Instructions (Addendum)
Meralgia Paresthetica  Meralgia paresthetica (MP) is a disorder characterized by tingling, numbness, and burning pain in the outer side of the thigh. It occurs in men more than women. MP is generally found in middle-aged or overweight people. Sometimes, the disorder may disappear. CAUSES The disorder is caused by a nerve in the thigh being squeezed (compressed). MP may be associated with tight clothing, pregnancy, diabetes, and being overweight (obese). SYMPTOMS  Tingling, numbness, and burning in the outer thigh.  An area of the skin may be painful and sensitive to the touch. The symptoms often worsen after walking or standing. TREATMENT  Treatment is based on your symptoms and is mainly supportive. Treatment may include:  Wearing looser clothing.  Losing weight.  Avoiding prolonged standing or walking.  Taking medication.  Surgery if the pain is peristent or severe. MP usually eases or disappears after treatment. Surgery is not always fully successful. Document Released: 09/10/2002 Document Revised: 12/13/2011 Document Reviewed: 09/20/2005 [facility] Patient Information 2013 [place], [place]. Lateral Epicondylitis (Tennis Elbow) with Rehab Lateral epicondylitis involves inflammation and pain around the outer portion of the elbow. The pain is caused by inflammation of the tendons in the forearm that bring back (extend) the wrist. Lateral epicondylittis is also called tennis elbow, because it is very common in tennis players. However, it may occur in any individual who extends the wrist repetitively. If lateral epicondylitis is left untreated, it may become a chronic problem. SYMPTOMS   Pain, tenderness, and inflammation on the outer (lateral) side of the elbow.  Pain or weakness with gripping activities.  Pain that increases with wrist twisting motions (playing tennis, using a screwdriver, opening a door or a jar).  Pain with lifting objects, including a coffee cup. CAUSES    Lateral epicondylitis is caused by inflammation of the tendons that extend the wrist. Causes of injury may include:  Repetitive stress and strain on the muscles and tendons that extend the wrist.  Sudden change in activity level or intensity.  Incorrect grip in racquet sports.  Incorrect grip size of racquet (often too large).  Incorrect hitting position or technique (usually backhand, leading with the elbow).  Using a racket that is too heavy. RISK INCREASES WITH:  Sports or occupations that require repetitive and/or strenuous forearm and wrist movements (tennis, squash, racquetball, carpentry).  Poor wrist and forearm strength and flexibility.  Failure to warm up properly before activity.  Resuming activity before healing, rehabilitation, and conditioning are complete. PREVENTION   Warm up and stretch properly before activity.  Maintain physical fitness:  Strength, flexibility, and endurance.  Cardiovascular fitness.  Wear and use properly fitted equipment.  Learn and use proper technique and have a coach correct improper technique.  Wear a tennis elbow (counterforce) brace. PROGNOSIS  The course of this condition depends on the degree of the injury. If treated properly, acute cases (symptoms lasting less than 4 weeks) are often resolved in 2 to 6 weeks. Chronic (longer lasting cases) often resolve in 3 to 6 months, but may require physical therapy. RELATED COMPLICATIONS   Frequently recurring symptoms, resulting in a chronic problem. Properly treating the problem the first time decreases frequency of recurrence.  Chronic inflammation, scarring tendon degeneration, and partial tendon tear, requiring surgery.  Delayed healing or resolution of symptoms. TREATMENT  Treatment first involves the use of ice and medicine, to reduce pain and inflammation. Strengthening and stretching exercises may help reduce discomfort, if performed regularly. These exercises may be  performed at home, if the condition  is an acute injury. Chronic cases may require a referral to a physical therapist for evaluation and treatment. Your caregiver may advise a corticosteroid injection, to help reduce inflammation. Rarely, surgery is needed. MEDICATION  If pain medicine is needed, nonsteroidal anti-inflammatory medicines (aspirin and ibuprofen), or other minor pain relievers (acetaminophen), are often advised.  Do not take pain medicine for 7 days before surgery.  Prescription pain relievers may be given, if your caregiver thinks they are needed. Use only as directed and only as much as you need.  Corticosteroid injections may be recommended. These injections should be reserved only for the most severe cases, because they can only be given a certain number of times. HEAT AND COLD  Cold treatment (icing) should be applied for 10 to 15 minutes every 2 to 3 hours for inflammation and pain, and immediately after activity that aggravates your symptoms. Use ice packs or an ice massage.  Heat treatment may be used before performing stretching and strengthening activities prescribed by your caregiver, physical therapist, or athletic trainer. Use a heat pack or a warm water soak. SEEK MEDICAL CARE IF: Symptoms get worse or do not improve in 2 weeks, despite treatment. EXERCISES  RANGE OF MOTION (ROM) AND STRETCHING EXERCISES - Epicondylitis, Lateral (Tennis Elbow) These exercises may help you when beginning to rehabilitate your injury. Your symptoms may go away with or without further involvement from your physician, physical therapist or athletic trainer. While completing these exercises, remember:   Restoring tissue flexibility helps normal motion to return to the joints. This allows healthier, less painful movement and activity.  An effective stretch should be held for at least 30 seconds.  A stretch should never be painful. You should only feel a gentle lengthening or release in  the stretched tissue. RANGE OF MOTION  Wrist Flexion, Active-Assisted  Extend your right / left elbow with your fingers pointing down.*  Gently pull the back of your hand towards you, until you feel a gentle stretch on the top of your forearm.  Hold this position for __________ seconds. Repeat __________ times. Complete this exercise __________ times per day.  *If directed by your physician, physical therapist or athletic trainer, complete this stretch with your elbow bent, rather than extended. RANGE OF MOTION  Wrist Extension, Active-Assisted  Extend your right / left elbow and turn your palm upwards.*  Gently pull your palm and fingertips back, so your wrist extends and your fingers point more toward the ground.  You should feel a gentle stretch on the inside of your forearm.  Hold this position for __________ seconds. Repeat __________ times. Complete this exercise __________ times per day. *If directed by your physician, physical therapist or athletic trainer, complete this stretch with your elbow bent, rather than extended. STRETCH - Wrist Flexion  Place the back of your right / left hand on a tabletop, leaving your elbow slightly bent. Your fingers should point away from your body.  Gently press the back of your hand down onto the table by straightening your elbow. You should feel a stretch on the top of your forearm.  Hold this position for __________ seconds. Repeat __________ times. Complete this stretch __________ times per day.  STRETCH  Wrist Extension   Place your right / left fingertips on a tabletop, leaving your elbow slightly bent. Your fingers should point backwards.  Gently press your fingers and palm down onto the table by straightening your elbow. You should feel a stretch on the inside of your forearm.  Hold this position for __________ seconds. Repeat __________ times. Complete this stretch __________ times per day.  STRENGTHENING EXERCISES -  Epicondylitis, Lateral (Tennis Elbow) These exercises may help you when beginning to rehabilitate your injury. They may resolve your symptoms with or without further involvement from your physician, physical therapist or athletic trainer. While completing these exercises, remember:   Muscles can gain both the endurance and the strength needed for everyday activities through controlled exercises.  Complete these exercises as instructed by your physician, physical therapist or athletic trainer. Increase the resistance and repetitions only as guided.  You may experience muscle soreness or fatigue, but the pain or discomfort you are trying to eliminate should never worsen during these exercises. If this pain does get worse, stop and make sure you are following the directions exactly. If the pain is still present after adjustments, discontinue the exercise until you can discuss the trouble with your caregiver. STRENGTH Wrist Flexors  Sit with your right / left forearm palm-up and fully supported on a table or countertop. Your elbow should be resting below the height of your shoulder. Allow your wrist to extend over the edge of the surface.  Loosely holding a __________ weight, or a piece of rubber exercise band or tubing, slowly curl your hand up toward your forearm.  Hold this position for __________ seconds. Slowly lower the wrist back to the starting position in a controlled manner. Repeat __________ times. Complete this exercise __________ times per day.  STRENGTH  Wrist Extensors  Sit with your right / left forearm palm-down and fully supported on a table or countertop. Your elbow should be resting below the height of your shoulder. Allow your wrist to extend over the edge of the surface.  Loosely holding a __________ weight, or a piece of rubber exercise band or tubing, slowly curl your hand up toward your forearm.  Hold this position for __________ seconds. Slowly lower the wrist back to the  starting position in a controlled manner. Repeat __________ times. Complete this exercise __________ times per day.  STRENGTH - Ulnar Deviators  Stand with a ____________________ weight in your right / left hand, or sit while holding a rubber exercise band or tubing, with your healthy arm supported on a table or countertop.  Move your wrist, so that your pinkie travels toward your forearm and your thumb moves away from your forearm.  Hold this position for __________ seconds and then slowly lower the wrist back to the starting position. Repeat __________ times. Complete this exercise __________ times per day STRENGTH - Radial Deviators  Stand with a ____________________ weight in your right / left hand, or sit while holding a rubber exercise band or tubing, with your injured arm supported on a table or countertop.  Raise your hand upward in front of you or pull up on the rubber tubing.  Hold this position for __________ seconds and then slowly lower the wrist back to the starting position. Repeat __________ times. Complete this exercise __________ times per day. STRENGTH  Forearm Supinators   Sit with your right / left forearm supported on a table, keeping your elbow below shoulder height. Rest your hand over the edge, palm down.  Gently grip a hammer or a soup ladle.  Without moving your elbow, slowly turn your palm and hand upward to a "thumbs-up" position.  Hold this position for __________ seconds. Slowly return to the starting position. Repeat __________ times. Complete this exercise __________ times per day.  STRENGTH  Forearm  Pronators   Sit with your right / left forearm supported on a table, keeping your elbow below shoulder height. Rest your hand over the edge, palm up.  Gently grip a hammer or a soup ladle.  Without moving your elbow, slowly turn your palm and hand upward to a "thumbs-up" position.  Hold this position for __________ seconds. Slowly return to the  starting position. Repeat __________ times. Complete this exercise __________ times per day.  STRENGTH - Grip  Grasp a tennis ball, a dense sponge, or a large, rolled sock in your hand.  Squeeze as hard as you can, without increasing any pain.  Hold this position for __________ seconds. Release your grip slowly. Repeat __________ times. Complete this exercise __________ times per day.  STRENGTH - Elbow Extensors, Isometric  Stand or sit upright, on a firm surface. Place your right / left arm so that your palm faces your stomach, and it is at the height of your waist.  Place your opposite hand on the underside of your forearm. Gently push up as your right / left arm resists. Push as hard as you can with both arms, without causing any pain or movement at your right / left elbow. Hold this stationary position for __________ seconds. Gradually release the tension in both arms. Allow your muscles to relax completely before repeating. Document Released: 09/20/2005 Document Revised: 12/13/2011 Document Reviewed: 01/02/2009 [facility] Patient Information 2013 [place], [place].

## 2012-07-11 NOTE — Progress Notes (Signed)
  Subjective:    Patient ID: Zachary Everett, male    DOB: 1950/10/17, 61 y.o.   MRN: [id number]  HPI  Pain Inventory Average Pain 5 Pain Right Now 3 My pain is intermittent  In the last 24 hours, has pain interfered with the following? General activity 0 Relation with others 0 Enjoyment of life 3 What TIME of day is your pain at its worst? evening Sleep (in general) Poor  Pain is worse with: sitting Pain improves with: medication Relief from Meds: 8  Mobility walk without assistance how many minutes can you walk? 30 ability to climb steps?  yes do you drive?  yes Do you have any goals in this area?  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  no  Neuro/Psych numbness  Prior Studies Any changes since last visit?  no x-rays  Physicians involved in your care Any changes since last visit?  no   Family History  Problem Relation Age of Onset  . Kidney disease Mother    History   Social History  . Marital Status: Married    Spouse Name: N/A    Number of Children: N/A  . Years of Education: N/A   Social History Main Topics  . Smoking status: Former Smoker    Quit date: 02/23/1986  . Smokeless tobacco: None  . Alcohol Use: 1.8 oz/week    3 Glasses of wine per week  . Drug Use: None  . Sexually Active: None   Other Topics Concern  . None   Social History Narrative  . None   Past Surgical History  Procedure Date  . Laparoscopic underlay ventral hernia repair w/ mesh 07-17-2010    PERIUMBILICAL VENTRAL HERNIA  . Laminectomy 1987    L5 - S1  . Circumcision 02/25/2012    Procedure: CIRCUMCISION ADULT;  Surgeon: Garnett Farm, MD;  Location: [facility];  Service: Urology;  Laterality: N/A;  1 hour requested for this procedure   Past Medical History  Diagnosis Date  . Postlaminectomy syndrome, lumbar region   . Lumbar spondylosis   . Migraines   . Thalassemia minor   . Testosterone  deficiency   . Phimosis   . Chronic back pain   . Hyperlipidemia    BP 141/87  Pulse 99  Resp 14  Ht 6\' 1"  (1.854 m)  Wt 233 lb (105.688 kg)  BMI 30.74 kg/m2  SpO2 95%     Review of Systems  Musculoskeletal: Positive for myalgias, back pain and arthralgias.  Neurological: Positive for numbness.  All other systems reviewed and are negative.       Objective:   Physical Exam  Nursing note and vitals reviewed.         Assessment & Plan:

## 2012-08-25 ENCOUNTER — Other Ambulatory Visit: Payer: Self-pay | Admitting: Physical Medicine & Rehabilitation

## 2012-08-25 ENCOUNTER — Telehealth: Payer: Self-pay | Admitting: *Deleted

## 2012-08-25 NOTE — Telephone Encounter (Signed)
Patient is going out of town for Thanksgiving. He is leaving Saturday. Would like an early refill on his Tramadol. Will not have enough to make it through his trip.

## 2012-08-25 NOTE — Telephone Encounter (Signed)
Notified medication refilled.

## 2012-10-15 ENCOUNTER — Other Ambulatory Visit: Payer: Self-pay | Admitting: Physical Medicine & Rehabilitation

## 2012-11-28 DIAGNOSIS — R7301 Impaired fasting glucose: Secondary | ICD-10-CM | POA: Insufficient documentation

## 2013-01-08 ENCOUNTER — Ambulatory Visit: Payer: Self-pay | Admitting: Physical Medicine & Rehabilitation

## 2013-01-08 ENCOUNTER — Ambulatory Visit: Payer: Self-pay

## 2013-01-24 ENCOUNTER — Other Ambulatory Visit: Payer: Self-pay | Admitting: *Deleted

## 2013-01-24 MED ORDER — TRAMADOL HCL 50 MG PO TABS: 100.0000 mg | ORAL_TABLET | Freq: Four times a day (QID) | ORAL | Status: DC | PRN

## 2013-01-24 NOTE — Telephone Encounter (Signed)
Tramadol refilled per pharmacy request.

## 2013-01-29 ENCOUNTER — Encounter: Payer: 59 | Attending: Physical Medicine & Rehabilitation

## 2013-01-29 ENCOUNTER — Encounter: Payer: Self-pay | Admitting: Physical Medicine & Rehabilitation

## 2013-01-29 ENCOUNTER — Ambulatory Visit (HOSPITAL_BASED_OUTPATIENT_CLINIC_OR_DEPARTMENT_OTHER): Payer: 59 | Admitting: Physical Medicine & Rehabilitation

## 2013-01-29 VITALS — BP 143/77 | HR 94 | Resp 14 | Ht 73.0 in | Wt 234.0 lb

## 2013-01-29 DIAGNOSIS — M961 Postlaminectomy syndrome, not elsewhere classified: Secondary | ICD-10-CM

## 2013-01-29 DIAGNOSIS — M47817 Spondylosis without myelopathy or radiculopathy, lumbosacral region: Secondary | ICD-10-CM

## 2013-01-29 DIAGNOSIS — E785 Hyperlipidemia, unspecified: Secondary | ICD-10-CM | POA: Insufficient documentation

## 2013-01-29 DIAGNOSIS — G8928 Other chronic postprocedural pain: Secondary | ICD-10-CM | POA: Insufficient documentation

## 2013-01-29 DIAGNOSIS — M545 Low back pain, unspecified: Secondary | ICD-10-CM | POA: Insufficient documentation

## 2013-01-29 DIAGNOSIS — M47816 Spondylosis without myelopathy or radiculopathy, lumbar region: Secondary | ICD-10-CM

## 2013-01-29 NOTE — Progress Notes (Signed)
Subjective:    Patient ID: Zachary Everett, male    DOB: [birth year], 62 y.o.   MRN: [id number] No new problems today. Primarily back pain which worsens after he plays golf or stands in one place for long per time. We checked x-rays and I reviewed the results with the patient. He has a history of lumbar discectomy and at L5-S1 has a markedly narrowed disc space. There is no evidence of instrumentation. At L4-L5 there is a 3 mm anterolisthesis and some associated facet degenerative changes at that same level. Patient has had some type of back injections in the past which were not very helpful he does not remember what type they were however. He's not interested in any surgery at this time. Pain is well relieved by tramadol 2 tablets QID HPI Golf 18 holes, driving range once a week  Rides in cart  Pain Inventory Average Pain 6 Pain Right Now 4 My pain is burning and dull  In the last 24 hours, has pain interfered with the following? General activity 5 Relation with others 0 Enjoyment of life 6 What TIME of day is your pain at its worst? evening Sleep (in general) Poor  Pain is worse with: some activites Pain improves with: medication Relief from Meds: 9  Mobility walk without assistance how many minutes can you walk? 60+ ability to climb steps?  yes do you drive?  yes Do you have any goals in this area?  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  no   Family History  Problem Relation Age of Onset  . Kidney disease Mother    History   Social History  . Marital Status: Married    Spouse Name: N/A    Number of Children: N/A  . Years of Education: N/A   Social History Main Topics  . Smoking status: Former Smoker    Quit date: 02/23/1986  . Smokeless tobacco: None  . Alcohol Use: 1.8 oz/week    3 Glasses of wine per week  .  Drug Use: None  . Sexually Active: None   Other Topics Concern  . None   Social History Narrative  . None   Past Surgical History  Procedure Laterality Date  . Laparoscopic underlay ventral hernia repair w/ mesh  07-17-2010    PERIUMBILICAL VENTRAL HERNIA  . Laminectomy  1987    L5 - S1  . Circumcision  02/25/2012    Procedure: CIRCUMCISION ADULT;  Surgeon: Garnett Farm, MD;  Location: [facility];  Service: Urology;  Laterality: N/A;  1 hour requested for this procedure   Past Medical History  Diagnosis Date  . Postlaminectomy syndrome, lumbar region   . Lumbar spondylosis   . Migraines   . Thalassemia minor   . Testosterone deficiency   . Phimosis   . Chronic back pain   . Hyperlipidemia    BP 143/77  Pulse 94  Resp 14  Ht 6\' 1"  (1.854 m)  Wt 234 lb (106.142 kg)  BMI 30.88 kg/m2  SpO2 95%     Review of Systems  Musculoskeletal: Positive for back pain.  All other systems reviewed and are negative.       Objective:   Physical Exam  Nursing note and vitals reviewed. Constitutional: He is oriented to person, place, and time. He appears well-developed and well-nourished.  HENT:  Head: Atraumatic.  Eyes: Conjunctivae and EOM are normal. Pupils are equal, round, and reactive to light.  Musculoskeletal:       Lumbar back: He exhibits decreased range of motion. He exhibits no tenderness, no deformity and no spasm.  Pain with extension greater than with flexion of the lumbar spine. No pain with twisting  Neurological: He is alert and oriented to person, place, and time.          Assessment & Plan:  1. 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 Chronic S1 radiculopathyHe has responded quite well to tramadol and has not had any progression in his pain. We did discuss his x-ray findings in great detail including looking at the films as well as looking at a spine model.  2. Lumbar spondylosis without evidence of myelopathy.  Should his pain progressed I would favor trying facet injections.

## 2013-03-05 ENCOUNTER — Ambulatory Visit (INDEPENDENT_AMBULATORY_CARE_PROVIDER_SITE_OTHER): Payer: 59 | Admitting: Family Medicine

## 2013-03-05 VITALS — BP 130/78 | HR 84 | Temp 98.8°F | Resp 16 | Ht 73.0 in | Wt 230.2 lb

## 2013-03-05 DIAGNOSIS — R233 Spontaneous ecchymoses: Secondary | ICD-10-CM

## 2013-03-05 DIAGNOSIS — T148 Other injury of unspecified body region: Secondary | ICD-10-CM

## 2013-03-05 DIAGNOSIS — Z23 Encounter for immunization: Secondary | ICD-10-CM

## 2013-03-05 DIAGNOSIS — W57XXXA Bitten or stung by nonvenomous insect and other nonvenomous arthropods, initial encounter: Secondary | ICD-10-CM

## 2013-03-05 DIAGNOSIS — D563 Thalassemia minor: Secondary | ICD-10-CM

## 2013-03-05 DIAGNOSIS — D649 Anemia, unspecified: Secondary | ICD-10-CM

## 2013-03-05 LAB — POCT CBC
Granulocyte percent: 72.5 %G (ref 37–80)
HCT, POC: 36.4 % — AB (ref 43.5–53.7)
MCV: 64.5 fL — AB (ref 80–97)
MID (cbc): 0.4 (ref 0–0.9)
POC Granulocyte: 5.7 (ref 2–6.9)
Platelet Count, POC: 230 10*3/uL (ref 142–424)
RBC: 5.64 M/uL (ref 4.69–6.13)

## 2013-03-05 NOTE — Progress Notes (Signed)
[address]   [place], [place]  [address]   [phone]  Subjective:    Patient ID: Zachary Everett, male    DOB: Jan 25, 1951, 63 y.o.   MRN: [id number]  HPI This 62 y.o. male presents for evaluation of insect bites.  Working out in yard; onset yesterday.  Two bites on each arm with surrounding ecchymoses.  No fever/chills/sweats; no headache/fatigue.  No abdominal pain, nausea, vomiting.  No other sites of bleeding.  Did not see insect that bit him.  No dizziness. Worried about a brown recluse spider bite.    Review of Systems  Constitutional: Negative for fever, chills, diaphoresis, activity change, appetite change and fatigue.  Eyes: Negative for photophobia and visual disturbance.  Gastrointestinal: Negative for nausea, vomiting and abdominal pain.  Skin: Positive for color change and wound. Negative for pallor and rash.  Neurological: Negative for dizziness, light-headedness and headaches.  Hematological: Negative for adenopathy. Bruises/bleeds easily.        Past Medical History  Diagnosis Date  . Postlaminectomy syndrome, lumbar region   . Lumbar spondylosis   . Migraines   . Thalassemia minor   . Testosterone deficiency   . Phimosis   . Chronic back pain   . Hyperlipidemia     Past Surgical History  Procedure Laterality Date  . Laparoscopic underlay ventral hernia repair w/ mesh  07-17-2010    PERIUMBILICAL VENTRAL HERNIA  . Laminectomy  1987    L5 - S1  . Circumcision  02/25/2012    Procedure: CIRCUMCISION ADULT;  Surgeon: Garnett Farm, MD;  Location: [facility];  Service: Urology;  Laterality: N/A;  1 hour requested for this procedure    Prior to Admission medications   Medication Sig Start Date End Date Taking? Authorizing Provider  atorvastatin (LIPITOR) 10 MG tablet Take 10 mg by mouth every evening.    Yes Historical Provider, MD  diclofenac sodium (VOLTAREN) 1 % GEL Apply 2 g topically 4 (four) times daily. 07/11/12  Yes Erick Colace, MD  ketoconazole (NIZORAL) 2 % cream  06/07/12  Yes Historical Provider, MD  LEVITRA 5 MG tablet  06/17/12  Yes Historical Provider, MD  testosterone (ANDROGEL) 50 MG/5GM GEL Place 5 g onto the skin daily. Testim?   Yes Historical Provider, MD  traMADol (ULTRAM) 50 MG tablet Take 2 tablets (100 mg total) by mouth every 6 (six) hours as needed for pain. 01/24/13  Yes Erick Colace, MD  desonide (DESOWEN) 0.05 % cream  06/07/12   Historical Provider, MD    Allergies  Allergen Reactions  . Other     MSG-migraine, swelling  . Penicillins Rash    History   Social History  . Marital Status: Married    Spouse Name: N/A    Number of Children: N/A  . Years of Education: N/A   Occupational History  . Not on file.   Social History Main Topics  . Smoking status: Former Smoker    Quit date: 02/23/1986  . Smokeless tobacco: Not on file  . Alcohol Use: 1.8 oz/week    3 Glasses of wine per week  . Drug Use: Not on file  . Sexually Active: Not on file   Other Topics Concern  . Not on file   Social History Narrative  . No narrative on file    Family History  Problem Relation Age of Onset  . Kidney disease Mother     Objective:   Physical Exam  Nursing note and vitals reviewed. Constitutional: He is oriented to person, place, and time. He appears well-developed and well-nourished. No distress.  HENT:  Head: Normocephalic and atraumatic.  Eyes: Conjunctivae and EOM are normal. Pupils are equal, round, and reactive to light.  Cardiovascular: Normal rate, regular rhythm, normal heart sounds and intact distal pulses.   No murmur heard. Pulmonary/Chest: Effort normal and breath sounds normal.  Neurological: He is alert and oriented to person, place, and time. No cranial nerve deficit. He exhibits normal muscle tone. Coordination normal.  Skin: Skin is warm and dry. He is not diaphoretic.  Two puncture wounds 2 mm diameter on B upper extremities with surrounding ecchymotic  areas 5 mm surrounding puncture wounds; no surrounding induration, erythema, tenderness, or streaking.  Psychiatric: He has a normal mood and affect. His behavior is normal.      Results for orders placed in visit on 03/05/13  POCT CBC      Result Value Range   WBC 7.9  4.6 - 10.2 K/uL   Lymph, poc 1.7  0.6 - 3.4   POC LYMPH PERCENT 21.9  10 - 50 %L   MID (cbc) 0.4  0 - 0.9   POC MID % 5.6  0 - 12 %M   POC Granulocyte 5.7  2 - 6.9   Granulocyte percent 72.5  37 - 80 %G   RBC 5.64  4.69 - 6.13 M/uL   Hemoglobin 10.8 (*) 14.1 - 18.1 g/dL   HCT, POC 16.1 (*) 09.6 - 53.7 %   MCV 64.5 (*) 80 - 97 fL   MCH, POC 19.1 (*) 27 - 31.2 pg   MCHC 29.7 (*) 31.8 - 35.4 g/dL   RDW, POC 04.5     Platelet Count, POC 230  142 - 424 K/uL   MPV    0 - 99.8 fL    Assessment & Plan:  Insect bite - Plan: Tdap vaccine greater than or equal to 7yo IM  Petechiae or ecchymoses - Plan: POCT CBC  Anemia  Thalassemia minor   No orders of the defined types were placed in this encounter.    1. Insect bite B upper extremities:  New.  With surrounding ecchymoses; advised to monitor closely for now. RTC for fever, pain, redness, swelling, other sites of bleeding, or development of necrosis surrounding wounds.  S/p TDAP. Local wound care reviewed in detail. 2.  Petechia/ecchymoses: New. Surrounding insect bites; monitor closely for now; return to clinic for worsening bruising or other sites of bleeding including epistaxis, hematuria, bloody stools, bleeding from gums.  3.  Anemia: New onset; Hemoglobin in 2013 of 13.3; thus, significant drop in past year; recommend follow-up with PCP in upcoming month for repeat labs. 4.  Thalassemia minor:  Chronic condition for patient; chronic intermittent anemia yet current anemia lower than baseline.

## 2013-03-05 NOTE — Patient Instructions (Addendum)
1.  KEEP WOUNDS CLEAN AND COVERED.   2.  RETURN FOR FEVER, CHILLS, DARKENING AROUND BITE SITES.

## 2013-05-21 ENCOUNTER — Other Ambulatory Visit: Payer: Self-pay

## 2013-05-21 MED ORDER — TRAMADOL HCL 50 MG PO TABS: 100.0000 mg | ORAL_TABLET | Freq: Four times a day (QID) | ORAL | Status: DC | PRN

## 2013-06-08 ENCOUNTER — Telehealth: Payer: Self-pay

## 2013-06-08 MED ORDER — TRAMADOL HCL 50 MG PO TABS: 100.0000 mg | ORAL_TABLET | Freq: Four times a day (QID) | ORAL | Status: DC | PRN

## 2013-06-08 NOTE — Telephone Encounter (Signed)
Ok to rf tramadol

## 2013-06-08 NOTE — Telephone Encounter (Signed)
This is AK's pt.

## 2013-06-08 NOTE — Telephone Encounter (Signed)
Called in refill to pharmacy and told them they may fill this early.  I called and spoke with Mr Zachary Everett and explained that his first call yesterday after 4 pm, Dr Wynn Banker is not in the office and does not com in until 9 am from hospital and starts seeing patients so that we could not refill this until we received approval from Dr Wynn Banker. He verbalized understanding.

## 2013-06-08 NOTE — Telephone Encounter (Signed)
Patient called and said he was vacation and lost his RX for Tramadol. He wants to know if he can get another RX for the remaining until the next refill is due. Please advise.

## 2013-09-12 ENCOUNTER — Telehealth: Payer: Self-pay | Admitting: Physical Medicine & Rehabilitation

## 2013-09-12 NOTE — Telephone Encounter (Signed)
PATIENT GOING OUT OF TOWN FOR WORK FOR THREE WEEKS TOMORROW.  WANTS TO REFILL HIS MEDS TODAY, WHICH IS ABOUT A WEEK EARLY.  OK TO REFILL TODAY?

## 2013-09-12 NOTE — Telephone Encounter (Signed)
Tramadol was due to be refilled on 12/5. Called Pharmacy and gave them the okay to refill the medication.

## 2013-09-30 ENCOUNTER — Ambulatory Visit (INDEPENDENT_AMBULATORY_CARE_PROVIDER_SITE_OTHER): Payer: 59 | Admitting: Family Medicine

## 2013-09-30 ENCOUNTER — Ambulatory Visit: Payer: 59

## 2013-09-30 VITALS — BP 136/80 | HR 134 | Temp 100.7°F | Resp 18 | Ht 73.0 in | Wt 244.4 lb

## 2013-09-30 DIAGNOSIS — R05 Cough: Secondary | ICD-10-CM

## 2013-09-30 DIAGNOSIS — R6889 Other general symptoms and signs: Secondary | ICD-10-CM

## 2013-09-30 DIAGNOSIS — R059 Cough, unspecified: Secondary | ICD-10-CM

## 2013-09-30 DIAGNOSIS — J209 Acute bronchitis, unspecified: Secondary | ICD-10-CM

## 2013-09-30 LAB — POCT CBC
Granulocyte percent: 85 %G — AB (ref 37–80)
MCH, POC: 19.2 pg — AB (ref 27–31.2)
MID (cbc): 0.7 (ref 0–0.9)
POC MID %: 6.2 %M (ref 0–12)
Platelet Count, POC: 206 10*3/uL (ref 142–424)
RBC: 5.63 M/uL (ref 4.69–6.13)
RDW, POC: 18.3 %
WBC: 11.8 10*3/uL — AB (ref 4.6–10.2)

## 2013-09-30 MED ORDER — CEFDINIR 300 MG PO CAPS: ORAL_CAPSULE | ORAL | Status: DC

## 2013-09-30 MED ORDER — HYDROCODONE-HOMATROPINE 5-1.5 MG/5ML PO SYRP: 5.0000 mL | ORAL_SOLUTION | ORAL | Status: DC | PRN

## 2013-09-30 NOTE — Patient Instructions (Signed)
Drink plenty of fluids   Get lots of rest  Tylenol or ibuprofen for fever and aching  Omnicef one twice daily for infection of bronchitis  Hycodan cough syrup 1 teaspoon every 4-6 hours as needed for cough  Take an over-the-counter antihistamine decongestant if needed such as Claritin-D, Allegra-D, Zyrtec-D

## 2013-09-30 NOTE — Progress Notes (Signed)
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 He had an upper or infections early in the week with cold-like symptoms. He didn't work the first part of the week because of that. He got feeling better from that and then Friday got worse with a cough and fever. Now he has body aches. He has a headache he also has a headache history. He does not smoke. He has a history of anemia from his thalassemia. His head is congested, is coughing, and just general he feels lousy.  Objective: Fevers noted. His skin is warm to touch. TMs normal. Throat clear. Nose congested. Chest has scattered rhonchi in both lungs. Heart regular without murmurs, slightly tachycardic. Skin unremarkable.  Assessment: Fever Cough Generalized malaise Flulike symptoms Rule out pneumonia  Plan: Flu swab, a chest x-ray, CBC  Results for orders placed in visit on 09/30/13  POCT INFLUENZA A/B      Result Value Range   Influenza A, POC Positive     Influenza B, POC Negative    POCT CBC      Result Value Range   WBC 11.8 (*) 4.6 - 10.2 K/uL   Lymph, poc 1.0  0.6 - 3.4   POC LYMPH PERCENT 8.8 (*) 10 - 50 %L   MID (cbc) 0.7  0 - 0.9   POC MID % 6.2  0 - 12 %M   POC Granulocyte 10.0 (*) 2 - 6.9   Granulocyte percent 85.0 (*) 37 - 80 %G   RBC 5.63  4.69 - 6.13 M/uL   Hemoglobin 10.8 (*) 14.1 - 18.1 g/dL   HCT, POC 47.8 (*) 29.5 - 53.7 %   MCV 63.8 (*) 80 - 97 fL   MCH, POC 19.2 (*) 27 - 31.2 pg   MCHC 30.1 (*) 31.8 - 35.4 g/dL   RDW, POC 62.1     Platelet Count, POC 206  142 - 424 K/uL   MPV    0 - 99.8 fL   [facility] reading (PRIMARY) by  Dr. Alwyn Ren Mildly increased pulmonary vascular markings no active infiltrate  Influenza with secondary bronchitis giving a little elevation of the white count History of thalassemia anemia.

## 2013-10-01 ENCOUNTER — Telehealth: Payer: Self-pay

## 2013-10-01 NOTE — Telephone Encounter (Signed)
Wife has called back and would like [place] to know his fever has not gone down either. It is consistently around 101.5

## 2013-10-01 NOTE — Telephone Encounter (Signed)
Cough is worse and wife is requesting something stronger.   [facility] [address]   [phone]

## 2013-10-02 NOTE — Telephone Encounter (Signed)
I agree with the advise.

## 2013-10-02 NOTE — Telephone Encounter (Signed)
He should use ibuprofen/ tylenol for the fever from the flu. He still has fever with the meds. She is giving him one ibuprofen. I have asked her to increase the ibuprofen to 800 mg tid and to bring him back in if this is not helpful. Also advised he should continue the hycodan and add in Mucinex for the cough. She will do this.

## 2013-10-04 ENCOUNTER — Ambulatory Visit (INDEPENDENT_AMBULATORY_CARE_PROVIDER_SITE_OTHER): Payer: 59 | Admitting: Physician Assistant

## 2013-10-04 VITALS — BP 150/98 | HR 110 | Temp 98.5°F | Resp 18 | Ht 72.0 in | Wt 240.0 lb

## 2013-10-04 DIAGNOSIS — J101 Influenza due to other identified influenza virus with other respiratory manifestations: Secondary | ICD-10-CM

## 2013-10-04 DIAGNOSIS — J111 Influenza due to unidentified influenza virus with other respiratory manifestations: Secondary | ICD-10-CM

## 2013-10-04 DIAGNOSIS — R059 Cough, unspecified: Secondary | ICD-10-CM

## 2013-10-04 DIAGNOSIS — R05 Cough: Secondary | ICD-10-CM

## 2013-10-04 LAB — POCT CBC
Granulocyte percent: 77.7 %G (ref 37–80)
HCT, POC: 40.4 % — AB (ref 43.5–53.7)
Hemoglobin: 12.2 g/dL — AB (ref 14.1–18.1)
LYMPH, POC: 2.1 (ref 0.6–3.4)
MCH: 19.1 pg — AB (ref 27–31.2)
MCHC: 30.2 g/dL — AB (ref 31.8–35.4)
MCV: 63.3 fL — AB (ref 80–97)
MID (CBC): 0.6 (ref 0–0.9)
PLATELET COUNT, POC: 322 10*3/uL (ref 142–424)
POC GRANULOCYTE: 9.6 — AB (ref 2–6.9)
POC LYMPH PERCENT: 17.3 %L (ref 10–50)
POC MID %: 5 % (ref 0–12)
RBC: 6.38 M/uL — AB (ref 4.69–6.13)
RDW, POC: 18.5 %
WBC: 12.4 10*3/uL — AB (ref 4.6–10.2)

## 2013-10-04 MED ORDER — IPRATROPIUM BROMIDE 0.03 % NA SOLN: 2.0000 | Freq: Two times a day (BID) | NASAL | Status: DC

## 2013-10-04 MED ORDER — HYDROCOD POLST-CHLORPHEN POLST 10-8 MG/5ML PO LQCR: 5.0000 mL | Freq: Two times a day (BID) | ORAL | Status: DC | PRN

## 2013-10-04 MED ORDER — IPRATROPIUM BROMIDE 0.02 % IN SOLN
0.5000 mg | Freq: Once | RESPIRATORY_TRACT | Status: AC
  Administered 2013-10-04: 0.5 mg via RESPIRATORY_TRACT

## 2013-10-04 MED ORDER — ALBUTEROL SULFATE (2.5 MG/3ML) 0.083% IN NEBU
2.5000 mg | INHALATION_SOLUTION | Freq: Once | RESPIRATORY_TRACT | Status: AC
  Administered 2013-10-04: 2.5 mg via RESPIRATORY_TRACT

## 2013-10-04 NOTE — Patient Instructions (Signed)
Continue to rest and hydrate. Continue the current treatment, but stop the ineffective cough medication. Add the Atrovent nasal spray, and start the new cough medication. Typically Influenza lasts 7 days.  Since it seems the flu symptoms began on 12/27, there are still another 48 hours or so. If there is no improvement in 48 hours, but now worsening, we can consider the addition of prednisone.

## 2013-10-04 NOTE — Progress Notes (Signed)
Subjective:    Patient ID: Zachary Everett, male    DOB: 12-22-1950, 63 y.o.   MRN: [id number]  PCP: No PCP Per Patient  Chief Complaint  Patient presents with  . Follow-up    Here Sunday night; experiencing same symptoms  . Shortness of Breath   Medications, allergies, past medical history, surgical history, family history, social history and problem list reviewed and updated.  HPI Presents will persistent illness.  Was seen here 12/28 with 1 week of symptoms that had begun to improve, and then worsened on 09/29/2013. He developed worsening cough, congestion, body aches. Flu test was positive, and WBC was mildly elevated.  CXR was negative for infiltrate. He was treated for a secondary bronchitis with cefdinir and Hycodan syrup.  No improvement.  Now wheezing. Tmax 102, since he started the antibiotic and Hycodan syrup. Concerned that there's been no change. Feels bad. Still achey. SOB with coughing.  He is accompanied today by his wife, [name]. Their daughter is due to deliver a child any day now.  Review of Systems No GU/GI symptoms.  No rash.    Objective:   Physical Exam  Blood pressure 150/98, pulse 110, temperature 98.5 F (36.9 C), temperature source Oral, resp. rate 18, height 6' (1.829 m), weight 240 lb (108.863 kg), SpO2 95.00%. Body mass index is 32.54 kg/(m^2). Well-developed, well nourished WM who is awake, alert and oriented, in NAD, but obviously feels poorly. HEENT: [place]/AT, PERRL, EOMI.  Sclera and conjunctiva are clear.  EAC are patent, TMs are normal in appearance. Nasal mucosa is congested, pink and moist, with clear rhinorrhea and some crusting. OP is clear. Neck: supple, non-tender, no lymphadenopathy, thyromegaly. Heart: RRR, no murmur Lungs: normal effort, CTA, though there are loud upper airway sounds. Extremities: no cyanosis, clubbing or edema. Skin: warm and dry without rash. Psychologic: good mood and appropriate affect, normal speech and  behavior.  Results for orders placed in visit on 10/04/13  POCT CBC      Result Value Range   WBC 12.4 (*) 4.6 - 10.2 K/uL   Lymph, poc 2.1  0.6 - 3.4   POC LYMPH PERCENT 17.3  10 - 50 %L   MID (cbc) 0.6  0 - 0.9   POC MID % 5.0  0 - 12 %M   POC Granulocyte 9.6 (*) 2 - 6.9   Granulocyte percent 77.7  37 - 80 %G   RBC 6.38 (*) 4.69 - 6.13 M/uL   Hemoglobin 12.2 (*) 14.1 - 18.1 g/dL   HCT, POC 40.4 (*) 43.5 - 53.7 %   MCV 63.3 (*) 80 - 97 fL   MCH, POC 19.1 (*) 27 - 31.2 pg   MCHC 30.2 (*) 31.8 - 35.4 g/dL   RDW, POC 18.5     Platelet Count, POC 322  142 - 424 K/uL   MPV    0 - 99.8 fL   He has thalassemia.       Assessment & Plan:  1. Cough 2. Influenza A  No improvement subjectively, and no objective changes on exam, after neb treatment.  Change from codeine to hydrocodone cough suppressant to see if he gets any improvement.  Continue the antibiotic, but I suspect that the worsening symptoms on 09/29/2013 were actually the beginning of the influenza rather than a secondary bacterial infection, given the course since then. Expect improvement in the next 48 hours, and if not, would consider oral steroid taper, as long as there is no worsening  in his other symptoms and fever resolves. - POCT CBC - albuterol (PROVENTIL) (2.5 MG/3ML) 0.083% nebulizer solution 2.5 mg; Take 3 mLs (2.5 mg total) by nebulization once. - ipratropium (ATROVENT) nebulizer solution 0.5 mg; Take 2.5 mLs (0.5 mg total) by nebulization once. - chlorpheniramine-HYDROcodone (TUSSIONEX PENNKINETIC ER) 10-8 MG/5ML LQCR; Take 5 mLs by mouth every 12 (twelve) hours as needed for cough (cough).  Dispense: 100 mL; Refill: 0 - ipratropium (ATROVENT) 0.03 % nasal spray; Place 2 sprays into both nostrils 2 (two) times daily.  Dispense: 30 mL; Refill: 0  Discussed with Dr. Tamala Julian. Supportive care.  Anticipatory guidance.  RTC if symptoms worsen/persist.   Fara Chute, PA-C Physician Assistant-Certified Urgent  [facility]

## 2013-10-30 ENCOUNTER — Encounter: Payer: Self-pay | Admitting: Physical Medicine & Rehabilitation

## 2013-10-30 ENCOUNTER — Encounter: Payer: 59 | Attending: Physical Medicine & Rehabilitation

## 2013-10-30 ENCOUNTER — Ambulatory Visit (HOSPITAL_BASED_OUTPATIENT_CLINIC_OR_DEPARTMENT_OTHER): Payer: 59 | Admitting: Physical Medicine & Rehabilitation

## 2013-10-30 ENCOUNTER — Other Ambulatory Visit: Payer: Self-pay

## 2013-10-30 VITALS — BP 156/86 | HR 120 | Resp 14 | Ht 73.0 in | Wt 246.0 lb

## 2013-10-30 DIAGNOSIS — M961 Postlaminectomy syndrome, not elsewhere classified: Secondary | ICD-10-CM

## 2013-10-30 DIAGNOSIS — M47817 Spondylosis without myelopathy or radiculopathy, lumbosacral region: Secondary | ICD-10-CM | POA: Insufficient documentation

## 2013-10-30 DIAGNOSIS — M47816 Spondylosis without myelopathy or radiculopathy, lumbar region: Secondary | ICD-10-CM

## 2013-10-30 DIAGNOSIS — M545 Low back pain, unspecified: Secondary | ICD-10-CM | POA: Insufficient documentation

## 2013-10-30 DIAGNOSIS — G8928 Other chronic postprocedural pain: Secondary | ICD-10-CM | POA: Insufficient documentation

## 2013-10-30 MED ORDER — TRAMADOL HCL 50 MG PO TABS: 100.0000 mg | ORAL_TABLET | Freq: Four times a day (QID) | ORAL | Status: DC | PRN

## 2013-10-30 NOTE — Patient Instructions (Signed)
Back Exercises These exercises may help you when beginning to rehabilitate your injury. Your symptoms may resolve with or without further involvement from your physician, physical therapist or athletic trainer. While completing these exercises, remember:   Restoring tissue flexibility helps normal motion to return to the joints. This allows healthier, less painful movement and activity.  An effective stretch should be held for at least 30 seconds.  A stretch should never be painful. You should only feel a gentle lengthening or release in the stretched tissue. STRETCH  Extension, Prone on Elbows   Lie on your stomach on the floor, a bed will be too soft. Place your palms about shoulder width apart and at the height of your head.  Place your elbows under your shoulders. If this is too painful, stack pillows under your chest.  Allow your body to relax so that your hips drop lower and make contact more completely with the floor.  Hold this position for __________ seconds.  Slowly return to lying flat on the floor. Repeat __________ times. Complete this exercise __________ times per day.  RANGE OF MOTION  Extension, Prone Press Ups   Lie on your stomach on the floor, a bed will be too soft. Place your palms about shoulder width apart and at the height of your head.  Keeping your back as relaxed as possible, slowly straighten your elbows while keeping your hips on the floor. You may adjust the placement of your hands to maximize your comfort. As you gain motion, your hands will come more underneath your shoulders.  Hold this position __________ seconds.  Slowly return to lying flat on the floor. Repeat __________ times. Complete this exercise __________ times per day.  RANGE OF MOTION- Quadruped, Neutral Spine   Assume a hands and knees position on a firm surface. Keep your hands under your shoulders and your knees under your hips. You may place padding under your knees for comfort.  Drop  your head and point your tail bone toward the ground below you. This will round out your low back like an angry cat. Hold this position for __________ seconds.  Slowly lift your head and release your tail bone so that your back sags into a large arch, like an old horse.  Hold this position for __________ seconds.  Repeat this until you feel limber in your low back.  Now, find your "sweet spot." This will be the most comfortable position somewhere between the two previous positions. This is your neutral spine. Once you have found this position, tense your stomach muscles to support your low back.  Hold this position for __________ seconds. Repeat __________ times. Complete this exercise __________ times per day.  STRETCH  Flexion, Single Knee to Chest   Lie on a firm bed or floor with both legs extended in front of you.  Keeping one leg in contact with the floor, bring your opposite knee to your chest. Hold your leg in place by either grabbing behind your thigh or at your knee.  Pull until you feel a gentle stretch in your low back. Hold __________ seconds.  Slowly release your grasp and repeat the exercise with the opposite side. Repeat __________ times. Complete this exercise __________ times per day.  STRETCH - Hamstrings, Standing  Stand or sit and extend your right / left leg, placing your foot on a chair or foot stool  Keeping a slight arch in your low back and your hips straight forward.  Lead with your chest and   lean forward at the waist until you feel a gentle stretch in the back of your right / left knee or thigh. (When done correctly, this exercise requires leaning only a small distance.)  Hold this position for __________ seconds. Repeat __________ times. Complete this stretch __________ times per day. STRENGTHENING  Deep Abdominals, Pelvic Tilt   Lie on a firm bed or floor. Keeping your legs in front of you, bend your knees so they are both pointed toward the ceiling and  your feet are flat on the floor.  Tense your lower abdominal muscles to press your low back into the floor. This motion will rotate your pelvis so that your tail bone is scooping upwards rather than pointing at your feet or into the floor.  With a gentle tension and even breathing, hold this position for __________ seconds. Repeat __________ times. Complete this exercise __________ times per day.  STRENGTHENING  Abdominals, Crunches   Lie on a firm bed or floor. Keeping your legs in front of you, bend your knees so they are both pointed toward the ceiling and your feet are flat on the floor. Cross your arms over your chest.  Slightly tip your chin down without bending your neck.  Tense your abdominals and slowly lift your trunk high enough to just clear your shoulder blades. Lifting higher can put excessive stress on the low back and does not further strengthen your abdominal muscles.  Control your return to the starting position. Repeat __________ times. Complete this exercise __________ times per day.  STRENGTHENING  Quadruped, Opposite UE/LE Lift   Assume a hands and knees position on a firm surface. Keep your hands under your shoulders and your knees under your hips. You may place padding under your knees for comfort.  Find your neutral spine and gently tense your abdominal muscles so that you can maintain this position. Your shoulders and hips should form a rectangle that is parallel with the floor and is not twisted.  Keeping your trunk steady, lift your right hand no higher than your shoulder and then your left leg no higher than your hip. Make sure you are not holding your breath. Hold this position __________ seconds.  Continuing to keep your abdominal muscles tense and your back steady, slowly return to your starting position. Repeat with the opposite arm and leg. Repeat __________ times. Complete this exercise __________ times per day. Document Released: 10/08/2005 Document  Revised: 12/13/2011 Document Reviewed: 01/02/2009 [employer] Patient Information 2014 [employer].  

## 2013-10-30 NOTE — Progress Notes (Signed)
Subjective:    Patient ID: Zachary Everett, male    DOB: 03-Jan-1951, 63 y.o.   MRN: [id number]  HPI No new problems today. Primarily back pain which worsens after he plays golf or stands in one place for long per time. We checked x-rays and I reviewed the results with the patient. He has a history of lumbar discectomy and at L5-S1 has a markedly narrowed disc space. There is no evidence of instrumentation. At L4-L5 there is a 3 mm anterolisthesis and some associated facet degenerative changes at that same level. Patient has had some type of back injections in the past which were not very helpful he does not remember what type they were however. He's not interested in any surgery at this time. Pain is well relieved by tramadol 2 tablets QID  Asking about exercises.  Poor sleep tried xanax from PCP which caused bad dreams Pain Inventory Average Pain 7 Pain Right Now 3 My pain is dull and aching  In the last 24 hours, has pain interfered with the following? General activity 3 Relation with others 2 Enjoyment of life 3 What TIME of day is your pain at its worst? morning and evening Sleep (in general) Poor  Pain is worse with: standing Pain improves with: medication Relief from Meds: 8  Mobility walk without assistance how many minutes can you walk? 30+ ability to climb steps?  yes do you drive?  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  no  Physicians involved in your care Any changes since last visit?  no   Family History  Problem Relation Age of Onset  . Kidney disease Mother    History   Social History  . Marital Status: Married    Spouse Name: [name]    Number of Children: 4  . Years of Education: N/A   Occupational History  . Primary school teacher   Social History Main Topics  . Smoking status: Former Smoker    Quit date: 02/23/1986  . Smokeless  tobacco: Never Used  . Alcohol Use: 1.8 oz/week    3 Glasses of wine per week  . Drug Use: None  . Sexual Activity: None   Other Topics Concern  . None   Social History Narrative   Lives with his wife.  All children are adults, and live independently, but within a mile of the patient.   Past Surgical History  Procedure Laterality Date  . Laparoscopic underlay ventral hernia repair w/ mesh  [id number]    PERIUMBILICAL VENTRAL HERNIA  . Laminectomy  1987    L5 - S1  . Circumcision  02/25/2012    Procedure: CIRCUMCISION ADULT;  Surgeon: Claybon Jabs, MD;  Location: [facility];  Service: Urology;  Laterality: N/A;  1 hour requested for this procedure   Past Medical History  Diagnosis Date  . Postlaminectomy syndrome, lumbar region   . Lumbar spondylosis   . Migraines   . Thalassemia minor   . Testosterone deficiency   . Phimosis   . Chronic back pain   . Hyperlipidemia    BP 156/86  Pulse 120  Resp 14  Ht 6\' 1"  (1.854 m)  Wt 246 lb (111.585 kg)  BMI 32.46 kg/m2  SpO2 94%     Review of Systems  Musculoskeletal: Positive for back pain.  All other systems reviewed and are negative.  Objective:   Physical Exam  Nursing note and vitals reviewed.  Constitutional: He is oriented to person, place, and time. He appears well-developed and well-nourished.  HENT:  Head: Atraumatic.  Eyes: Conjunctivae and EOM are normal. Pupils are equal, round, and reactive to light.  Musculoskeletal:  Lumbar back: He exhibits decreased range of motion. He exhibits no tenderness, no deformity and no spasm.  Pain with extension greater than with flexion of the lumbar spine. No pain with twisting  Neurological: He is alert and oriented to person, place, and time.        Assessment & Plan:  1. Lumbar postlaminectomy syndrome with chronic postoperative pain.Hx Chronic S1 radiculopathy but this has not been an issue  He has responded quite well to tramadol and has  not had any progression in his pain.  I gave him core strengthening exercises pelvic tilt and quadriped to avoid crunches and sit ups secondary to mesh.  Advise walking program, pt will join gym 2. Lumbar spondylosis without evidence of myelopathy. Should his pain progressed I would favor trying facet injections.

## 2014-02-02 ENCOUNTER — Ambulatory Visit (INDEPENDENT_AMBULATORY_CARE_PROVIDER_SITE_OTHER): Payer: 59 | Admitting: Internal Medicine

## 2014-02-02 VITALS — BP 138/80 | HR 82 | Temp 97.6°F | Resp 16 | Ht 70.5 in | Wt 236.6 lb

## 2014-02-02 DIAGNOSIS — L988 Other specified disorders of the skin and subcutaneous tissue: Secondary | ICD-10-CM

## 2014-02-02 DIAGNOSIS — IMO0002 Reserved for concepts with insufficient information to code with codable children: Secondary | ICD-10-CM

## 2014-02-02 DIAGNOSIS — M79645 Pain in left finger(s): Secondary | ICD-10-CM

## 2014-02-02 DIAGNOSIS — M79609 Pain in unspecified limb: Secondary | ICD-10-CM

## 2014-02-02 MED ORDER — HYDROCODONE-ACETAMINOPHEN 5-325 MG PO TABS: 1.0000 | ORAL_TABLET | Freq: Four times a day (QID) | ORAL | Status: DC | PRN

## 2014-02-02 MED ORDER — DOXYCYCLINE HYCLATE 100 MG PO TABS: 100.0000 mg | ORAL_TABLET | Freq: Two times a day (BID) | ORAL | Status: DC

## 2014-02-02 NOTE — Patient Instructions (Addendum)
Warm soaks to finger. Take antibiotics.  Recheck tomorrow if worse tomorrow otherwise recheck Monday at 8 am.

## 2014-02-02 NOTE — Progress Notes (Signed)
   Subjective:    Patient ID: Zachary Everett, male    DOB: 1951-07-24, 63 y.o.   MRN: [id number]  HPI  Chief Complaint  Patient presents with  . Cyst    L middle finger- been there for years, enlarged suddenly over the past couple of days  . Hand Pain    pain in L middle finger, red/swollen- running down finger towards hand  started swelling during round of golf Bites fingernails during sleep Hx "cyst " in this area for 61yrs w/out prior problems  prob  HL  AR  Hypogon  Chr back pain  No fever Tet 2 yr ago  Review of Systems noncontr    Objective:   Physical Exam BP 138/80  Pulse 82  Temp(Src) 97.6 F (36.4 C) (Oral)  Resp 16  Ht 5' 10.5" (1.791 m)  Wt 236 lb 9.6 oz (107.321 kg)  BMI 33.46 kg/m2  SpO2 95% Exam clear x L 3rd digit with redness and swelling with fluctuance of distal phalanx-very tender Nailbed clear No entrance wound       Assessment & Plan:  Acute felon  I&D Cult Doxy HC 5/325 Hot soaks Reck 48hrs

## 2014-02-02 NOTE — Progress Notes (Signed)
Procedure:  Consent obtained.  MC block with marcaine and 1% lido for successful anesthesia.  Lateral 1 cm incision made into the finger pad and purulent material expressed.  Wound culture obtained. Drsg placed on finger.  I have reviewed and agree with documentation. Robert P. Laney Pastor, M.D.

## 2014-02-05 LAB — WOUND CULTURE
GRAM STAIN: NONE SEEN
Gram Stain: NONE SEEN

## 2014-02-06 ENCOUNTER — Ambulatory Visit (INDEPENDENT_AMBULATORY_CARE_PROVIDER_SITE_OTHER): Payer: 59 | Admitting: Family Medicine

## 2014-02-06 VITALS — BP 110/80 | HR 102 | Temp 98.1°F | Resp 16 | Ht 71.75 in | Wt 239.0 lb

## 2014-02-06 DIAGNOSIS — IMO0002 Reserved for concepts with insufficient information to code with codable children: Secondary | ICD-10-CM

## 2014-02-06 DIAGNOSIS — M79646 Pain in unspecified finger(s): Secondary | ICD-10-CM

## 2014-02-06 DIAGNOSIS — M79609 Pain in unspecified limb: Secondary | ICD-10-CM

## 2014-02-06 MED ORDER — LEVOFLOXACIN 500 MG PO TABS: 500.0000 mg | ORAL_TABLET | Freq: Every day | ORAL | Status: DC

## 2014-02-06 NOTE — Progress Notes (Signed)
   Subjective:    Patient ID: Zachary Everett, male    DOB: 1951/04/10, 63 y.o.   MRN: [id number]  HPI 63 year old male presents for recheck of felon on left middle finger.  Was initially evaluated on 5/2 and area was incised. Started on doxycycline which he has been taking as prescribed - tolerating well. Instructed to f/u in 48 hours which he did not do. Did notice that 2 days after incision, another tender bump popped up medially. It has continued to increase in size and is quite tender.  Overall the swelling in his finger has decreased. No pain with ROM, fever, chills, nausea, vomiting.     Review of Systems  Constitutional: Negative for fever and chills.  Gastrointestinal: Negative for nausea and vomiting.  Skin: Positive for wound.       Objective:   Physical Exam  Constitutional: He is oriented to person, place, and time. He appears well-developed and well-nourished.  HENT:  Head: Normocephalic and atraumatic.  Right Ear: External ear normal.  Left Ear: External ear normal.  Eyes: Conjunctivae are normal.  Neck: Normal range of motion.  Cardiovascular: Normal rate.   Pulmonary/Chest: Effort normal.  Neurological: He is alert and oriented to person, place, and time.  Skin:  Left 3rd finger has small area of fluctuance medial to incision. No pulp space tenderness. Full ROM of finger without pain.  Finger is not swollen. No surrounding cellulitis  Psychiatric: He has a normal mood and affect. His behavior is normal. Judgment and thought content normal.    Culture: few viridans strep  22G needle used to open pustule. Moderate amount of purulence expressed.     Assessment & Plan:  Pain in finger  Felon  Will change antibiotic to Levaquin 500 mg daily x 7 days Stop doxycycline Recommend warm, soapy soaks 2-3 times daily x 48 hours. Recheck if symptoms worsening or fail to improve.

## 2014-04-29 ENCOUNTER — Ambulatory Visit: Payer: 59 | Admitting: Physical Medicine & Rehabilitation

## 2014-04-29 ENCOUNTER — Encounter: Payer: 59 | Attending: Physical Medicine & Rehabilitation

## 2014-05-13 ENCOUNTER — Other Ambulatory Visit: Payer: Self-pay | Admitting: Physical Medicine & Rehabilitation

## 2014-05-14 ENCOUNTER — Other Ambulatory Visit: Payer: Self-pay

## 2014-05-14 MED ORDER — TRAMADOL HCL 50 MG PO TABS: 100.0000 mg | ORAL_TABLET | Freq: Four times a day (QID) | ORAL | Status: DC | PRN

## 2014-05-14 NOTE — Telephone Encounter (Signed)
Tramadol refilled. Patient has appt on 9/4.

## 2014-06-07 ENCOUNTER — Encounter: Payer: 59 | Attending: Physical Medicine & Rehabilitation

## 2014-06-07 ENCOUNTER — Ambulatory Visit (HOSPITAL_BASED_OUTPATIENT_CLINIC_OR_DEPARTMENT_OTHER): Payer: 59 | Admitting: Physical Medicine & Rehabilitation

## 2014-06-07 ENCOUNTER — Encounter: Payer: Self-pay | Admitting: Physical Medicine & Rehabilitation

## 2014-06-07 VITALS — BP 151/91 | HR 101 | Resp 16 | Ht 73.0 in | Wt 234.0 lb

## 2014-06-07 DIAGNOSIS — M961 Postlaminectomy syndrome, not elsewhere classified: Secondary | ICD-10-CM | POA: Insufficient documentation

## 2014-06-07 DIAGNOSIS — M25469 Effusion, unspecified knee: Secondary | ICD-10-CM | POA: Insufficient documentation

## 2014-06-07 DIAGNOSIS — G8928 Other chronic postprocedural pain: Secondary | ICD-10-CM | POA: Insufficient documentation

## 2014-06-07 DIAGNOSIS — M25562 Pain in left knee: Secondary | ICD-10-CM

## 2014-06-07 DIAGNOSIS — M25569 Pain in unspecified knee: Secondary | ICD-10-CM

## 2014-06-07 NOTE — Progress Notes (Signed)
Subjective:    Patient ID: Zachary Everett, male    DOB: 1951/05/31, 63 y.o.   MRN: [id number]  HPI Patient returns after last visit in January. He has a history of lumbar discectomy at L5-S1 without instrumentation. At L4-5 there is anterolisthesis as well as facet degeneration. He has been fairly well maintained on tramadol 2 tablets 4 times a day. In the interval time. 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 He was quite depressed about this. He states that the industry  experienced a down turn and he could not weather the storm. 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 He is driving more since switching jobs. He also sits more in his home office.  In addition he has had increasing knee pain. His left knee became very swollen. He saw an orthopedic surgeon and had a large amount of fluid drained. He also had a cortisone injection. He has been wearing a knee brace since that time. He does feel it is getting better.   Pain Inventory Average Pain 7 Pain Right Now 7 My pain is sharp, dull and aching  In the last 24 hours, has pain interfered with the following? General activity 4 Relation with others 2 Enjoyment of life 4 What TIME of day is your pain at its worst? morning,evening Sleep (in general) Fair  Pain is worse with: inactivity and standing Pain improves with: medication Relief from Meds: 8  Mobility walk without assistance how many minutes can you walk? 60+ ability to climb steps?  yes do you drive?  yes transfers alone Do you have any goals in this area?  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  yes  Neuro/Psych numbness  Prior Studies Any changes since last visit?  yes x-rays  Physicians involved in your care Any changes since last visit?  no   Family History  Problem Relation Age of Onset  . Kidney disease Mother    History   Social History  . Marital Status: Married    Spouse Name: [name]     Number of Children: 4  . Years of Education: N/A   Occupational History  . Primary school teacher   Social History Main Topics  . Smoking status: Former Smoker    Quit date: 02/23/1986  . Smokeless tobacco: Never Used  . Alcohol Use: 1.8 oz/week    3 Glasses of wine per week  . Drug Use: None  . Sexual Activity: None   Other Topics Concern  . None   Social History Narrative   Lives with his wife.  All children are adults, and live independently, but within a mile of the patient.   Past Surgical History  Procedure Laterality Date  . Laparoscopic underlay ventral hernia repair w/ mesh  [id number]    PERIUMBILICAL VENTRAL HERNIA  . Laminectomy  1987    L5 - S1  . Circumcision  02/25/2012    Procedure: CIRCUMCISION ADULT;  Surgeon: Claybon Jabs, MD;  Location: [facility];  Service: Urology;  Laterality: N/A;  1 hour requested for this procedure   Past Medical History  Diagnosis Date  . Postlaminectomy syndrome, lumbar region   . Lumbar spondylosis   . Migraines   . Thalassemia minor   . Testosterone deficiency   . Phimosis   . Chronic back pain   . Hyperlipidemia    BP 151/91  Pulse  101  Resp 16  Ht 6\' 1"  (1.854 m)  Wt 234 lb (106.142 kg)  BMI 30.88 kg/m2  SpO2 97%  Opioid Risk Score:   Fall Risk Score: Moderate Fall Risk (6-13 points) (pt educated, declined handout)    Review of Systems  Neurological: Positive for numbness.  All other systems reviewed and are negative.      Objective:   Physical Exam  Lumbar spine has normal range of motion. He has no pain to palpation. Negative straight leg raising test Motor strength is normal in the lower extremities. Mood and affect are appropriate  Gait shows no evidence of toe drag or need stability. He does wear a knee orthosis.       Assessment & Plan:  1. Lumbar postlaminectomy syndrome with chronic postoperative pain continued tramadol 2 tablets 4 times a day no signs of abuse.  Return in 6 months  We discussed at length activity modifications that may help offset the increased driving that he is doing as part of his new job. We also discussed modifications to his home office to also minimize prolonged sitting  2. Left knee swelling. Probable meniscal degeneration. Followup orthopedics. We discussed that bicycling would be good exercise for this as well as working out at his pool  Over half of the 25 min visit was spent counseling and coordinating care.

## 2014-06-07 NOTE — Patient Instructions (Signed)
Recommend bicycling for exercise as well as pool

## 2014-12-06 ENCOUNTER — Encounter: Payer: BLUE CROSS/BLUE SHIELD | Attending: Physical Medicine & Rehabilitation

## 2014-12-06 ENCOUNTER — Ambulatory Visit (HOSPITAL_BASED_OUTPATIENT_CLINIC_OR_DEPARTMENT_OTHER): Payer: BLUE CROSS/BLUE SHIELD | Admitting: Physical Medicine & Rehabilitation

## 2014-12-06 ENCOUNTER — Encounter: Payer: Self-pay | Admitting: Physical Medicine & Rehabilitation

## 2014-12-06 VITALS — BP 134/67 | HR 95 | Resp 14

## 2014-12-06 DIAGNOSIS — M25562 Pain in left knee: Secondary | ICD-10-CM | POA: Insufficient documentation

## 2014-12-06 DIAGNOSIS — M961 Postlaminectomy syndrome, not elsewhere classified: Secondary | ICD-10-CM | POA: Diagnosis present

## 2014-12-06 DIAGNOSIS — M47816 Spondylosis without myelopathy or radiculopathy, lumbar region: Secondary | ICD-10-CM | POA: Diagnosis not present

## 2014-12-06 MED ORDER — TRAMADOL HCL 50 MG PO TABS: 100.0000 mg | ORAL_TABLET | Freq: Four times a day (QID) | ORAL | Status: DC | PRN

## 2014-12-06 NOTE — Progress Notes (Signed)
Subjective:    Patient ID: Zachary Everett, male    DOB: 18-Nov-1950, 64 y.o.   MRN: [id number]  HPI Patient returns after last visit in January. He has a history of lumbar discectomy at L5-S1 without instrumentation. At L4-5 there is anterolisthesis as well as facet degeneration. He has been fairly well maintained on tramadol 2 tablets 4 times a day. In the interval time. 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 He was quite depressed about this. He states that the industry  experienced a down turn and he could not weather the storm.   Intermittent Swelling Left knee    Pain Inventory Average Pain 6 Pain Right Now 6 My pain is intermittent, sharp, dull and stabbing  In the last 24 hours, has pain interfered with the following? General activity 6 Relation with others 5 Enjoyment of life 7 What TIME of day is your pain at its worst? morning, evening  Sleep (in general) Fair  Pain is worse with: bending and lifting Pain improves with: medication Relief from Meds: 8  Mobility walk without assistance how many minutes can you walk? 30+ ability to climb steps?  yes do you drive?  yes  Function not employed: date last employed 11/08/2014  Neuro/Psych numbness  Prior Studies Any changes since last visit?  no  Physicians involved in your care Any changes since last visit?  no   Family History  Problem Relation Age of Onset  . Kidney disease Mother    History   Social History  . Marital Status: Married    Spouse Name: [name]  . Number of Children: 4  . Years of Education: N/A   Occupational History  . Primary school teacher   Social History Main Topics  . Smoking status: Former Smoker    Quit date: 02/23/1986  . Smokeless tobacco: Never Used  . Alcohol Use: 1.8 oz/week    3 Glasses of wine per week  . Drug Use: Not on file  . Sexual Activity: Not on file   Other Topics Concern  . None   Social History Narrative   Lives with his wife.  All children are adults,  and live independently, but within a mile of the patient.   Past Surgical History  Procedure Laterality Date  . Laparoscopic underlay ventral hernia repair w/ mesh  [id number]    PERIUMBILICAL VENTRAL HERNIA  . Laminectomy  1987    L5 - S1  . Circumcision  02/25/2012    Procedure: CIRCUMCISION ADULT;  Surgeon: Claybon Jabs, MD;  Location: [facility];  Service: Urology;  Laterality: N/A;  1 hour requested for this procedure   Past Medical History  Diagnosis Date  . Postlaminectomy syndrome, lumbar region   . Lumbar spondylosis   . Migraines   . Thalassemia minor   . Testosterone deficiency   . Phimosis   . Chronic back pain   . Hyperlipidemia    There were no vitals taken for this visit.  Opioid Risk Score:   Fall Risk Score:    Review of Systems     Objective:   Physical Exam  Constitutional: He is oriented to person, place, and time. He appears well-developed and well-nourished.  HENT:  Head: Normocephalic and atraumatic.  Eyes: Pupils are equal, round, and reactive to light.  Neurological: He is alert and oriented to person, place, and time.  Psychiatric: He has a normal mood and affect.  Nursing note and vitals reviewed.  Left knee  has no evidence of erythema, mild effusion, no tenderness to palpation along the medial or lateral joint line and no tenderness along the hamstring or patellar tendons.  Back has no tenderness to palpation in his lumbar flexion is normal however his extension is limited to about 25-50% of normal motion  Negative straight leg raising Normal strength in the lower extremities       Assessment & Plan:  1. Lumbar spondylosis and lumbar degenerative discAs well as lumbar postlaminectomy syndrome evidence of radiculopathy. Continue tramadol 50 mg 2 tablets 4 times per day  2. Left knee pain likely meniscal tear, no clicking or locking, has been evaluated by orthopedics did not recommend surgery

## 2014-12-06 NOTE — Progress Notes (Signed)
MD revised pt to return in 6 months not 4 weeks

## 2014-12-06 NOTE — Patient Instructions (Signed)
Back Exercises These exercises may help you when beginning to rehabilitate your injury. Your symptoms may resolve with or without further involvement from your physician, physical therapist or athletic trainer. While completing these exercises, remember:   Restoring tissue flexibility helps normal motion to return to the joints. This allows healthier, less painful movement and activity.  An effective stretch should be held for at least 30 seconds.  A stretch should never be painful. You should only feel a gentle lengthening or release in the stretched tissue. STRETCH - Extension, Prone on Elbows   Lie on your stomach on the floor, a bed will be too soft. Place your palms about shoulder width apart and at the height of your head.  Place your elbows under your shoulders. If this is too painful, stack pillows under your chest.  Allow your body to relax so that your hips drop lower and make contact more completely with the floor.  Hold this position for __________ seconds.  Slowly return to lying flat on the floor. Repeat __________ times. Complete this exercise __________ times per day.  RANGE OF MOTION - Extension, Prone Press Ups   Lie on your stomach on the floor, a bed will be too soft. Place your palms about shoulder width apart and at the height of your head.  Keeping your back as relaxed as possible, slowly straighten your elbows while keeping your hips on the floor. You may adjust the placement of your hands to maximize your comfort. As you gain motion, your hands will come more underneath your shoulders.  Hold this position __________ seconds.  Slowly return to lying flat on the floor. Repeat __________ times. Complete this exercise __________ times per day.  RANGE OF MOTION- Quadruped, Neutral Spine   Assume a hands and knees position on a firm surface. Keep your hands under your shoulders and your knees under your hips. You may place padding under your knees for  comfort.  Drop your head and point your tail bone toward the ground below you. This will round out your low back like an angry cat. Hold this position for __________ seconds.  Slowly lift your head and release your tail bone so that your back sags into a large arch, like an old horse.  Hold this position for __________ seconds.  Repeat this until you feel limber in your low back.  Now, find your "sweet spot." This will be the most comfortable position somewhere between the two previous positions. This is your neutral spine. Once you have found this position, tense your stomach muscles to support your low back.  Hold this position for __________ seconds. Repeat __________ times. Complete this exercise __________ times per day.  STRETCH - Flexion, Single Knee to Chest   Lie on a firm bed or floor with both legs extended in front of you.  Keeping one leg in contact with the floor, bring your opposite knee to your chest. Hold your leg in place by either grabbing behind your thigh or at your knee.  Pull until you feel a gentle stretch in your low back. Hold __________ seconds.  Slowly release your grasp and repeat the exercise with the opposite side. Repeat __________ times. Complete this exercise __________ times per day.  STRETCH - Hamstrings, Standing  Stand or sit and extend your right / left leg, placing your foot on a chair or foot stool  Keeping a slight arch in your low back and your hips straight forward.  Lead with your chest and   lean forward at the waist until you feel a gentle stretch in the back of your right / left knee or thigh. (When done correctly, this exercise requires leaning only a small distance.)  Hold this position for __________ seconds. Repeat __________ times. Complete this stretch __________ times per day. STRENGTHENING - Deep Abdominals, Pelvic Tilt   Lie on a firm bed or floor. Keeping your legs in front of you, bend your knees so they are both pointed  toward the ceiling and your feet are flat on the floor.  Tense your lower abdominal muscles to press your low back into the floor. This motion will rotate your pelvis so that your tail bone is scooping upwards rather than pointing at your feet or into the floor.  With a gentle tension and even breathing, hold this position for __________ seconds. Repeat __________ times. Complete this exercise __________ times per day.  STRENGTHENING - Abdominals, Crunches   Lie on a firm bed or floor. Keeping your legs in front of you, bend your knees so they are both pointed toward the ceiling and your feet are flat on the floor. Cross your arms over your chest.  Slightly tip your chin down without bending your neck.  Tense your abdominals and slowly lift your trunk high enough to just clear your shoulder blades. Lifting higher can put excessive stress on the low back and does not further strengthen your abdominal muscles.  Control your return to the starting position. Repeat __________ times. Complete this exercise __________ times per day.  STRENGTHENING - Quadruped, Opposite UE/LE Lift   Assume a hands and knees position on a firm surface. Keep your hands under your shoulders and your knees under your hips. You may place padding under your knees for comfort.  Find your neutral spine and gently tense your abdominal muscles so that you can maintain this position. Your shoulders and hips should form a rectangle that is parallel with the floor and is not twisted.  Keeping your trunk steady, lift your right hand no higher than your shoulder and then your left leg no higher than your hip. Make sure you are not holding your breath. Hold this position __________ seconds.  Continuing to keep your abdominal muscles tense and your back steady, slowly return to your starting position. Repeat with the opposite arm and leg. Repeat __________ times. Complete this exercise __________ times per day. Document Released:  10/08/2005 Document Revised: 12/13/2011 Document Reviewed: 01/02/2009 [employer] Patient Information 2015 [employer]. This information is not intended to replace advice given to you by your health care provider. Make sure you discuss any questions you have with your health care provider.  

## 2015-06-10 ENCOUNTER — Ambulatory Visit: Payer: BLUE CROSS/BLUE SHIELD | Admitting: Physical Medicine & Rehabilitation

## 2015-06-15 ENCOUNTER — Other Ambulatory Visit: Payer: Self-pay | Admitting: Physical Medicine & Rehabilitation

## 2015-06-16 ENCOUNTER — Ambulatory Visit: Payer: BLUE CROSS/BLUE SHIELD | Admitting: Physical Medicine & Rehabilitation

## 2015-06-16 ENCOUNTER — Encounter: Payer: BLUE CROSS/BLUE SHIELD | Attending: Physical Medicine & Rehabilitation

## 2015-06-16 ENCOUNTER — Encounter: Payer: Self-pay | Admitting: Physical Medicine & Rehabilitation

## 2015-06-16 ENCOUNTER — Ambulatory Visit (HOSPITAL_BASED_OUTPATIENT_CLINIC_OR_DEPARTMENT_OTHER): Payer: BLUE CROSS/BLUE SHIELD | Admitting: Physical Medicine & Rehabilitation

## 2015-06-16 VITALS — BP 139/88 | HR 88

## 2015-06-16 DIAGNOSIS — E785 Hyperlipidemia, unspecified: Secondary | ICD-10-CM | POA: Insufficient documentation

## 2015-06-16 DIAGNOSIS — M47816 Spondylosis without myelopathy or radiculopathy, lumbar region: Secondary | ICD-10-CM | POA: Insufficient documentation

## 2015-06-16 DIAGNOSIS — M25562 Pain in left knee: Secondary | ICD-10-CM

## 2015-06-16 DIAGNOSIS — M961 Postlaminectomy syndrome, not elsewhere classified: Secondary | ICD-10-CM | POA: Insufficient documentation

## 2015-06-16 DIAGNOSIS — M545 Low back pain: Secondary | ICD-10-CM | POA: Insufficient documentation

## 2015-06-16 DIAGNOSIS — Z87891 Personal history of nicotine dependence: Secondary | ICD-10-CM | POA: Diagnosis not present

## 2015-06-16 MED ORDER — TRAMADOL HCL 50 MG PO TABS: 100.0000 mg | ORAL_TABLET | Freq: Four times a day (QID) | ORAL | Status: DC | PRN

## 2015-06-16 NOTE — Patient Instructions (Signed)
Stay active.

## 2015-06-16 NOTE — Progress Notes (Signed)
Subjective:    Patient ID: Zachary Everett, male    DOB: Jul 13, 1951, 64 y.o.   MRN: [id number]  HPI Working PT  At golf course  Left meniscus sometimes hurts Back still sore  Is playing golf at times  Sleeps on recliner  Discussed Back pain as well as knee pain. Overall he is doing well he feels like he gets almost 100% pain relief from the tramadol. He does not feel like he can quit this medicine. He feels at least psychologically dependent on this. Pain Inventory Average Pain 7 Pain Right Now 7 My pain is sharp, burning and dull  In the last 24 hours, has pain interfered with the following? General activity 4 Relation with others 2 Enjoyment of life 5 What TIME of day is your pain at its worst? morning and  night Sleep (in general) NA  Pain is worse with: bending and standing Pain improves with: medication Relief from Meds: 8  Mobility walk without assistance how many minutes can you walk? hours ability to climb steps?  yes do you drive?  yes  Function employed # of hrs/week 30 what is your job? [employer] retired  Neuro/Psych numbness tingling  Prior Studies Any changes since last visit?  no  Physicians involved in your care Any changes since last visit?  no   Family History  Problem Relation Age of Onset  . Kidney disease Mother    Social History   Social History  . Marital Status: Married    Spouse Name: [name]  . Number of Children: 4  . Years of Education: N/A   Occupational History  . Primary school teacher   Social History Main Topics  . Smoking status: Former Smoker    Quit date: 02/23/1986  . Smokeless tobacco: Never Used  . Alcohol Use: 1.8 oz/week    3 Glasses of wine per week  . Drug Use: None  . Sexual Activity: Not Asked   Other Topics Concern  . None   Social History Narrative   Lives with his wife.  All children are adults, and live independently, but within a mile of the patient.   Past Surgical History    Procedure Laterality Date  . Laparoscopic underlay ventral hernia repair w/ mesh  [id number]    PERIUMBILICAL VENTRAL HERNIA  . Laminectomy  1987    L5 - S1  . Circumcision  02/25/2012    Procedure: CIRCUMCISION ADULT;  Surgeon: Claybon Jabs, MD;  Location: [facility];  Service: Urology;  Laterality: N/A;  1 hour requested for this procedure   Past Medical History  Diagnosis Date  . Postlaminectomy syndrome, lumbar region   . Lumbar spondylosis   . Migraines   . Thalassemia minor   . Testosterone deficiency   . Phimosis   . Chronic back pain   . Hyperlipidemia    BP 139/88 mmHg  Pulse 88  SpO2 96%  Opioid Risk Score:   Fall Risk Score:  `1  Depression screen PHQ 2/9  No flowsheet data found.   Review of Systems  Neurological: Positive for numbness.       Tingling  All other systems reviewed and are negative.      Objective:   Physical Exam  Constitutional: He is oriented to person, place, and time. He appears well-developed and well-nourished.  HENT:  Head: Normocephalic and atraumatic.  Eyes: Conjunctivae and EOM are normal. Pupils are equal, round, and reactive to light.  Neck: Normal range of motion.  Musculoskeletal:       Left knee: No tenderness found. No medial joint line, no lateral joint line and no patellar tendon tenderness noted.       Lumbar back: He exhibits normal range of motion and no tenderness.  Neurological: He is alert and oriented to person, place, and time.  Psychiatric: He has a normal mood and affect.  Nursing note and vitals reviewed.  Ambulation without evidence of toe drag or knee instability       Assessment & Plan:  1. Chronic low back pain and lumbar spondylosis we discussed injections once again but given his excellent relief with tramadol he like to continue this rather than trying another treatment. We discussed the tramadol is less likely to have side effects than other opioids. He is not having any chronic  constipation. He does feel like he is dependent on the medicine. He's had no signs of misuse.  2. Chronic left knee pain meniscal tear. At this point has some chronic knee swelling but this is really not affecting his function. He really does not want any interventional procedures at this time.

## 2015-09-07 ENCOUNTER — Encounter (HOSPITAL_COMMUNITY): Payer: Self-pay | Admitting: *Deleted

## 2015-09-07 ENCOUNTER — Emergency Department (HOSPITAL_COMMUNITY)
Admission: EM | Admit: 2015-09-07 | Discharge: 2015-09-07 | Disposition: A | Discharge status: Home / Self Care | Payer: BLUE CROSS/BLUE SHIELD | Attending: Emergency Medicine | Admitting: Emergency Medicine

## 2015-09-07 ENCOUNTER — Emergency Department (HOSPITAL_COMMUNITY): Payer: BLUE CROSS/BLUE SHIELD

## 2015-09-07 DIAGNOSIS — Z87891 Personal history of nicotine dependence: Secondary | ICD-10-CM | POA: Insufficient documentation

## 2015-09-07 DIAGNOSIS — Z8739 Personal history of other diseases of the musculoskeletal system and connective tissue: Secondary | ICD-10-CM | POA: Diagnosis not present

## 2015-09-07 DIAGNOSIS — Z862 Personal history of diseases of the blood and blood-forming organs and certain disorders involving the immune mechanism: Secondary | ICD-10-CM | POA: Diagnosis not present

## 2015-09-07 DIAGNOSIS — Z79899 Other long term (current) drug therapy: Secondary | ICD-10-CM | POA: Insufficient documentation

## 2015-09-07 DIAGNOSIS — F1310 Sedative, hypnotic or anxiolytic abuse, uncomplicated: Secondary | ICD-10-CM | POA: Insufficient documentation

## 2015-09-07 DIAGNOSIS — Z87438 Personal history of other diseases of male genital organs: Secondary | ICD-10-CM | POA: Diagnosis not present

## 2015-09-07 DIAGNOSIS — G43909 Migraine, unspecified, not intractable, without status migrainosus: Secondary | ICD-10-CM | POA: Insufficient documentation

## 2015-09-07 DIAGNOSIS — E785 Hyperlipidemia, unspecified: Secondary | ICD-10-CM | POA: Insufficient documentation

## 2015-09-07 DIAGNOSIS — G8929 Other chronic pain: Secondary | ICD-10-CM | POA: Diagnosis not present

## 2015-09-07 DIAGNOSIS — R443 Hallucinations, unspecified: Secondary | ICD-10-CM | POA: Diagnosis present

## 2015-09-07 DIAGNOSIS — Z791 Long term (current) use of non-steroidal anti-inflammatories (NSAID): Secondary | ICD-10-CM | POA: Insufficient documentation

## 2015-09-07 DIAGNOSIS — Z880 Allergy status to penicillin: Secondary | ICD-10-CM | POA: Insufficient documentation

## 2015-09-07 DIAGNOSIS — F329 Major depressive disorder, single episode, unspecified: Secondary | ICD-10-CM

## 2015-09-07 DIAGNOSIS — R441 Visual hallucinations: Secondary | ICD-10-CM

## 2015-09-07 DIAGNOSIS — F32A Depression, unspecified: Secondary | ICD-10-CM

## 2015-09-07 LAB — CBC WITH DIFFERENTIAL/PLATELET
BASOS ABS: 0.1 10*3/uL (ref 0.0–0.1)
Basophils Relative: 1 %
EOS PCT: 2 %
Eosinophils Absolute: 0.2 10*3/uL (ref 0.0–0.7)
HEMATOCRIT: 39.1 % (ref 39.0–52.0)
HEMOGLOBIN: 12.5 g/dL — AB (ref 13.0–17.0)
LYMPHS PCT: 33 %
Lymphs Abs: 2.5 10*3/uL (ref 0.7–4.0)
MCH: 18.9 pg — ABNORMAL LOW (ref 26.0–34.0)
MCHC: 32 g/dL (ref 30.0–36.0)
MCV: 59.2 fL — AB (ref 78.0–100.0)
MONOS PCT: 8 %
Monocytes Absolute: 0.6 10*3/uL (ref 0.1–1.0)
NEUTROS ABS: 4.3 10*3/uL (ref 1.7–7.7)
Neutrophils Relative %: 56 %
Platelets: 195 10*3/uL (ref 150–400)
RBC: 6.6 MIL/uL — ABNORMAL HIGH (ref 4.22–5.81)
RDW: 17.3 % — ABNORMAL HIGH (ref 11.5–15.5)
WBC: 7.7 10*3/uL (ref 4.0–10.5)

## 2015-09-07 LAB — COMPREHENSIVE METABOLIC PANEL
ALBUMIN: 3.9 g/dL (ref 3.5–5.0)
ALK PHOS: 91 U/L (ref 38–126)
ALT: 29 U/L (ref 17–63)
ANION GAP: 8 (ref 5–15)
AST: 23 U/L (ref 15–41)
BILIRUBIN TOTAL: 0.7 mg/dL (ref 0.3–1.2)
BUN: 17 mg/dL (ref 6–20)
CALCIUM: 9.2 mg/dL (ref 8.9–10.3)
CO2: 28 mmol/L (ref 22–32)
Chloride: 105 mmol/L (ref 101–111)
Creatinine, Ser: 0.99 mg/dL (ref 0.61–1.24)
GFR calc Af Amer: >60 mL/min (ref >60)
GLUCOSE: 116 mg/dL — AB (ref 65–99)
Potassium: 4.2 mmol/L (ref 3.5–5.1)
Sodium: 141 mmol/L (ref 135–145)
TOTAL PROTEIN: 7.2 g/dL (ref 6.5–8.1)

## 2015-09-07 LAB — URINALYSIS, ROUTINE W REFLEX MICROSCOPIC
Bilirubin Urine: NEGATIVE
Glucose, UA: NEGATIVE mg/dL
Ketones, ur: NEGATIVE mg/dL
Leukocytes, UA: NEGATIVE
Nitrite: NEGATIVE
Protein, ur: NEGATIVE mg/dL
Specific Gravity, Urine: 1.02 (ref 1.005–1.030)
pH: 5 (ref 5.0–8.0)

## 2015-09-07 LAB — URINE MICROSCOPIC-ADD ON
Bacteria, UA: NONE SEEN
Squamous Epithelial / HPF: NONE SEEN
WBC, UA: NONE SEEN WBC/hpf (ref 0–5)

## 2015-09-07 LAB — RAPID URINE DRUG SCREEN, HOSP PERFORMED
Amphetamines: NOT DETECTED
Barbiturates: NOT DETECTED
Benzodiazepines: POSITIVE — AB
Cocaine: NOT DETECTED
Opiates: NOT DETECTED
Tetrahydrocannabinol: NOT DETECTED

## 2015-09-07 LAB — TSH: TSH: 1.97 u[IU]/mL (ref 0.350–4.500)

## 2015-09-07 LAB — MAGNESIUM: Magnesium: 2 mg/dL (ref 1.7–2.4)

## 2015-09-07 LAB — SALICYLATE LEVEL: Salicylate Lvl: <4 mg/dL (ref 2.8–30.0)

## 2015-09-07 LAB — ETHANOL: Alcohol, Ethyl (B): <5 mg/dL (ref <5)

## 2015-09-07 NOTE — ED Provider Notes (Signed)
CSN: [id number]     Arrival date & time 09/07/15  1151 History   First MD Initiated Contact with Patient 09/07/15 1158     Chief Complaint  Patient presents with  . Hallucinations     (Consider location/radiation/quality/duration/timing/severity/associated sxs/prior Treatment) The history is provided by the patient.  ASLAM DAUN is a 64 y.o. male hx of testosterone deficiency, chronic back pain, here with depression, visual hallucinations. Patient states that for the last week and a half he has been seeing some writing on the floor. Patient states that he sometimes saw his name on the floor but denies seeing any bugs crawling. Denies blurry vision. Denies auditory hallucinations. 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 Not on meds for depression. Has occasional alcohol use, denies illicit drug use. Denies fevers. Told daughter yesterday, told to come for evaluation.    Past Medical History  Diagnosis Date  . Postlaminectomy syndrome, lumbar region   . Lumbar spondylosis   . Migraines   . Thalassemia minor   . Testosterone deficiency   . Phimosis   . Chronic back pain   . Hyperlipidemia    Past Surgical History  Procedure Laterality Date  . Laparoscopic underlay ventral hernia repair w/ mesh  0000000    PERIUMBILICAL VENTRAL HERNIA  . Laminectomy  1987    L5 - S1  . Circumcision  02/25/2012    Procedure: CIRCUMCISION ADULT;  Surgeon: Claybon Jabs, MD;  Location: [facility];  Service: Urology;  Laterality: N/A;  1 hour requested for this procedure   Family History  Problem Relation Age of Onset  . Kidney disease Mother    Social History  Substance Use Topics  . Smoking status: Former Smoker    Quit date: 02/23/1986  . Smokeless tobacco: Never Used  . Alcohol Use: 1.8 oz/week    3 Glasses of wine per week    Review of Systems  Psychiatric/Behavioral:       Visual hallucination   All other systems reviewed and  are negative.     Allergies  Other and Penicillins  Home Medications   Prior to Admission medications   Medication Sig Start Date End Date Taking? Authorizing Provider  ALPRAZolam Duanne Moron) 0.5 MG tablet Take 0.5 mg by mouth at bedtime as needed for anxiety.  10/18/13  Yes Historical Provider, MD  aspirin-acetaminophen-caffeine (EXCEDRIN MIGRAINE) [phone] MG tablet Take 2 tablets by mouth every 6 (six) hours as needed for headache.   Yes Historical Provider, MD  atorvastatin (LIPITOR) 20 MG tablet TAKE 1 TABLET (20 MG TOTAL) BY MOUTH DAILY. 10/28/14  Yes Historical Provider, MD  CALCIUM PO Take 2 tablets by mouth daily.   Yes Historical Provider, MD  diclofenac sodium (VOLTAREN) 1 % GEL Apply 2 g topically 4 (four) times daily. 07/11/12  Yes Charlett Blake, MD  ibuprofen (ADVIL,MOTRIN) 200 MG tablet Take 400 mg by mouth every 6 (six) hours as needed for moderate pain.   Yes Historical Provider, MD  MAGNESIUM PO Take 1 tablet by mouth daily.   Yes Historical Provider, MD  Riboflavin (VITAMIN B-2 PO) Take 1 tablet by mouth at bedtime.   Yes Historical Provider, MD  Testosterone (ANDROGEL PUMP) 20.25 MG/ACT (1.62%) GEL APPLY 4 PUMPS ONTO THE SKIN DAILY AS DIRECTED 08/18/15  Yes Historical Provider, MD  traMADol (ULTRAM) 50 MG tablet Take 2 tablets (100 mg total) by mouth every 6 (six) hours as needed. 06/16/15  Yes Luanna Salk  Kirsteins, MD  FLUARIX QUADRIVALENT 0.5 ML injection FLU SHOT 07/18/15   Historical Provider, MD  ketoconazole (NIZORAL) 2 % cream  06/07/12   Historical Provider, MD   BP 159/109 mmHg  Pulse 98  Temp(Src) 98 F (36.7 C) (Oral)  Resp 22  SpO2 100% Physical Exam  Constitutional: He is oriented to person, place, and time. He appears well-developed and well-nourished.  HENT:  Head: Normocephalic.  Mouth/Throat: Oropharynx is clear and moist.  Eyes: Conjunctivae and EOM are normal. Pupils are equal, round, and reactive to light.  Neck: Normal range of motion. Neck  supple.  Cardiovascular: Normal rate, regular rhythm and normal heart sounds.   Pulmonary/Chest: Effort normal and breath sounds normal. No respiratory distress. He has no wheezes. He has no rales.  Abdominal: Soft. Bowel sounds are normal. He exhibits no distension. There is no tenderness. There is no rebound.  Musculoskeletal: Normal range of motion. He exhibits no edema or tenderness.  Neurological: He is alert and oriented to person, place, and time. No cranial nerve deficit. Coordination normal.  CN 2-12 intact. Nl strength throughout. Nl sensation throughout. Nl gait   Skin: Skin is warm and dry.  Psychiatric: He has a normal mood and affect. His behavior is normal. Judgment and thought content normal.  Nursing note and vitals reviewed.   ED Course  Procedures (including critical care time) Labs Review Labs Reviewed  CBC WITH DIFFERENTIAL/PLATELET - Abnormal; Notable for the following:    RBC 6.60 (*)    Hemoglobin 12.5 (*)    MCV 59.2 (*)    MCH 18.9 (*)    RDW 17.3 (*)    All other components within normal limits  COMPREHENSIVE METABOLIC PANEL - Abnormal; Notable for the following:    Glucose, Bld 116 (*)    All other components within normal limits  URINE RAPID DRUG SCREEN, HOSP PERFORMED - Abnormal; Notable for the following:    Benzodiazepines POSITIVE (*)    All other components within normal limits  URINALYSIS, ROUTINE W REFLEX MICROSCOPIC (NOT AT [facility]) - Abnormal; Notable for the following:    Hgb urine dipstick TRACE (*)    All other components within normal limits  TSH  ETHANOL  SALICYLATE LEVEL  MAGNESIUM  URINE MICROSCOPIC-ADD ON    Imaging Review Ct Head Wo Contrast  09/07/2015  CLINICAL DATA:  Altered mental status with visual hallucinations. Initial encounter. EXAM: CT HEAD WITHOUT CONTRAST TECHNIQUE: Contiguous axial images were obtained from the base of the skull through the vertex without intravenous contrast. COMPARISON:  None. FINDINGS: There is no  evidence of acute intracranial hemorrhage, mass lesion, brain edema or extra-axial fluid collection. The ventricles and subarachnoid spaces are appropriately sized for age. There is no CT evidence of acute cortical infarction. The visualized paranasal sinuses, mastoid air cells and middle ears are clear. The calvarium is intact. IMPRESSION: Negative noncontrast head CT. Electronically Signed   By: Richardean Sale M.D.   On: 09/07/2015 14:09   I have personally reviewed and evaluated these images and lab results as part of my medical decision-making.   EKG Interpretation None      MDM   Final diagnoses:  None    ABDUR NITCHER is a 64 y.o. male here with new onset visual hallucinations. No auditory hallucination. Will get labs, CT head to r/o intra cranial process, UA, UDS, tox. Will consult TTS.   3:07 PM CT head nl. Labs unremarkable. TSH nl. TTS saw patient and psych doesn't have time  to see patient today. They want to keep patient overnight for observation but patient refused. Patient is not suicidal or homicidal and doesn't require IVC. Has PCP, can be referred to psych outpatient.      Wandra Arthurs, MD 09/07/15 [phone]

## 2015-09-07 NOTE — BH Assessment (Addendum)
Tele Assessment Note   Zachary Everett is an 64 y.o. male. Pt denies SI/HI. Pt denies AVH. Pt reports auditory hallucinations. Pt reports seeing writing on the floors. Pt states that the writing is scrabbled. Pt's family has not seen any writing on the floors. 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 The Pt denies previous mental health treatment. Pt denies mental health medication. Pt states that he is taking the following over the counter medication for migraines: Magneisum and Vitamin B. Pt denies SA. Pt denies abuse. Pt reports family support.   Collateral Information from Pt's wife Zachary Everett: Per Zachary Everett the Pt has been reporting episodes of paranoia since 2013. Pt would not seek treatment. Per Zachary Everett the Pt has been accusing her of during porn movies and having affairs, and accusing their daughter of making sexual advances towards him. Zachary Everett states "I'm concerned about him."  Probation officer consulted with Dr. Darleene Cleaver. Per Dr. Darleene Cleaver Pt should be observed overnight and receive an am psych evaluation.   Update: Although Dr. Darleene Cleaver recommended an am psych evaluation Pt was D/C by EDP.  Diagnosis:  F33.3 MDD, w/ psychotic feature  Past Medical History:  Past Medical History  Diagnosis Date  . Postlaminectomy syndrome, lumbar region   . Lumbar spondylosis   . Migraines   . Thalassemia minor   . Testosterone deficiency   . Phimosis   . Chronic back pain   . Hyperlipidemia     Past Surgical History  Procedure Laterality Date  . Laparoscopic underlay ventral hernia repair w/ mesh  0000000    PERIUMBILICAL VENTRAL HERNIA  . Laminectomy  1987    L5 - S1  . Circumcision  02/25/2012    Procedure: CIRCUMCISION ADULT;  Surgeon: Zachary Jabs, MD;  Location: [facility];  Service: Urology;  Laterality: N/A;  1 hour requested for this procedure    Family History:  Family History  Problem Relation Age of  Onset  . Kidney disease Mother     Social History:  reports that he quit smoking about 29 years ago. He has never used smokeless tobacco. He reports that he drinks about 1.8 oz of alcohol per week. His drug history is not on file.  Additional Social History:  Alcohol / Drug Use Pain Medications: Pt denies Prescriptions: Pt denies Over the Counter: Magnesium, Vitamin B History of alcohol / drug use?: No history of alcohol / drug abuse Longest period of sobriety (when/how long): NA  CIWA: CIWA-Ar BP: (!) 159/109 mmHg Pulse Rate: 98 COWS:    PATIENT STRENGTHS: (choose at least two) Average or above average intelligence Communication skills  Allergies:  Allergies  Allergen Reactions  . Other     MSG-migraine, swelling  . Penicillins Rash    Has patient had a PCN reaction causing immediate rash, facial/tongue/throat swelling, SOB or lightheadedness with hypotension: Yes Has patient had a PCN reaction causing severe rash involving mucus membranes or skin necrosis: Yes -all over body. Has patient had a PCN reaction that required hospitalization Pt was already in the hospital  Has patient had a PCN reaction occurring within the last 10 years: No If all of the above answers are "NO", then may proceed with Cephalosporin use.     Home Medications:  (Not in a hospital admission)  OB/GYN Status:  No LMP for male patient.  General Assessment Data Location of Assessment: [facility] ED TTS Assessment: In system Is this a Tele  or Face-to-Face Assessment?: Face-to-Face Is this an Initial Assessment or a Re-assessment for this encounter?: Initial Assessment Marital status: Married [place] name: NA Is patient pregnant?: No Pregnancy Status: No Living Arrangements: Spouse/significant other Can pt return to current living arrangement?: Yes Admission Status: Voluntary Is patient capable of signing voluntary admission?: Yes Referral Source: Self/Family/Friend Insurance type: [place] Living Arrangements: Spouse/significant other Name of Psychiatrist: NA Name of Therapist: NA  Education Status Is patient currently in school?: No Current Grade: NA Highest grade of school patient has completed: [employer] Name of school: NA Contact person: NA  Risk to self with the past 6 months Suicidal Ideation: No Has patient been a risk to self within the past 6 months prior to admission? : No Suicidal Intent: No Has patient had any suicidal intent within the past 6 months prior to admission? : No Is patient at risk for suicide?: No Suicidal Plan?: No Has patient had any suicidal plan within the past 6 months prior to admission? : No Access to Means: No What has been your use of drugs/alcohol within the last 12 months?: NA Previous Attempts/Gestures: No How many times?: 0 Other Self Harm Risks: NA Triggers for Past Attempts: None known Intentional Self Injurious Behavior: None Family Suicide History: No Recent stressful life event(s): Financial Problems, Job Loss Persecutory voices/beliefs?: No Depression: Yes Depression Symptoms: Despondent, Loss of interest in usual pleasures, Feeling worthless/self pity, Feeling angry/irritable, Isolating, Tearfulness Substance abuse history and/or treatment for substance abuse?: No Suicide prevention information given to non-admitted patients: Not applicable  Risk to Others within the past 6 months Homicidal Ideation: No Does patient have any lifetime risk of violence toward others beyond the six months prior to admission? : No Thoughts of Harm to Others: No Current Homicidal Intent: No Current Homicidal Plan: No Access to Homicidal Means: No Identified Victim: NA History of harm to others?: No Assessment of Violence: None Noted Violent Behavior Description: NA Does patient have access to weapons?: No Criminal Charges Pending?: No Does patient have a court date: No Is patient on probation?: No  Psychosis Hallucinations:  Visual Delusions: Jealous  Mental Status Report Appearance/Hygiene: Unremarkable Eye Contact: Good Motor Activity: Freedom of movement Speech: Logical/coherent Level of Consciousness: Alert Mood: Depressed Affect: Depressed Anxiety Level: Moderate Thought Processes: Coherent, Relevant Judgement: Unimpaired Orientation: Person, Place, Time, Situation Obsessive Compulsive Thoughts/Behaviors: None  Cognitive Functioning Concentration: Normal Memory: Recent Intact, Remote Intact IQ: Average Insight: Fair Impulse Control: Fair Appetite: Fair Weight Loss: 0 Weight Gain: 0 Sleep: Decreased Total Hours of Sleep: 5 Vegetative Symptoms: None  ADLScreening [facility] Assessment Services) Patient's cognitive ability adequate to safely complete daily activities?: Yes Patient able to express need for assistance with ADLs?: Yes Independently performs ADLs?: Yes (appropriate for developmental age)  Prior Inpatient Therapy Prior Inpatient Therapy: No Prior Therapy Dates: NA Prior Therapy Facilty/Provider(s): NA Reason for Treatment: NA  Prior Outpatient Therapy Prior Outpatient Therapy: No Prior Therapy Dates: NA Prior Therapy Facilty/Provider(s): NA Reason for Treatment: NA Does patient have an ACCT team?: No Does patient have Intensive In-House Services?  : No Does patient have [facility] services? : No Does patient have P4CC services?: No  ADL Screening (condition at time of admission) Patient's cognitive ability adequate to safely complete daily activities?: Yes Is the patient deaf or have difficulty hearing?: No Does the patient have difficulty seeing, even when wearing glasses/contacts?: No Does the patient have difficulty concentrating, remembering, or making decisions?: No Patient able to  express need for assistance with ADLs?: Yes Does the patient have difficulty dressing or bathing?: No Independently performs ADLs?: Yes (appropriate for developmental age) Does the patient  have difficulty walking or climbing stairs?: No Weakness of Legs: None Weakness of Arms/Hands: None       Abuse/Neglect Assessment (Assessment to be complete while patient is alone) Physical Abuse: Denies Verbal Abuse: Denies Sexual Abuse: Denies Exploitation of patient/patient's resources: Denies Self-Neglect: Denies Values / Beliefs Cultural Requests During Hospitalization: None Spiritual Requests During Hospitalization: None   Advance Directives (For Healthcare) Does patient have an advance directive?: No Would patient like information on creating an advanced directive?: No - patient declined information    Additional Information 1:1 In Past 12 Months?: No CIRT Risk: No Elopement Risk: No Does patient have medical clearance?: No     Disposition:  Disposition Initial Assessment Completed for this Encounter: Yes Disposition of Patient: Other dispositions (am psych eval.Pending am evaluation') Other disposition(s): Other (Comment)  Marti Mclane D 09/07/2015 2:45 PM

## 2015-09-07 NOTE — ED Notes (Signed)
Pt reports for about a week now he is seeing things that his family can't see, like messages written on the floor, denies auditory hallucinations. Also reports cold symptoms.

## 2015-09-07 NOTE — ED Notes (Signed)
Pt states he sees words written.  Per wife, pt thought message was being written from daughter sneaking into the home.  Photos have been taken.  No words visible to others but patient still sees words in the photos.  No previous breakdown.  Pt has started new vitamins 2 weeks ago.  Magnesium and  "B 2" . Symptoms started x 1 1/2 weeks ago. Pt continues to believe words are real and does not acknowledge hallucination.  Pt denies command.  No SI/HI.  Not indicating what words are saying to staff.  Pt has had increase in migraines and has been taking the vitamins for this.  No visual changes with the migraines.

## 2015-09-07 NOTE — Discharge Instructions (Signed)
See your doctor.   You need to see a psychiatrist.   Return to ER if you have thoughts of harming yourself or others, worse hallucinations.

## 2015-09-07 NOTE — ED Notes (Signed)
Pt upset with plan to stay. Wife states pt is angry at her b/c she spoke with TTS.  RN spoke with psychiatry and state that b/c pt is unknown to him, pt has to be observed overnight prior to any clearance by psychiatry.  Pt is stating he does not want to stay. Will notify ED physician.

## 2015-10-21 IMAGING — CR DG CHEST 2V
2 series · 2 of 2 positions shown · non-contrast
Comparison: PA and lateral chest 07/16/2010.

CLINICAL DATA: Cough and fever for 1 week.

EXAM:
CHEST  2 VIEW

[PA]
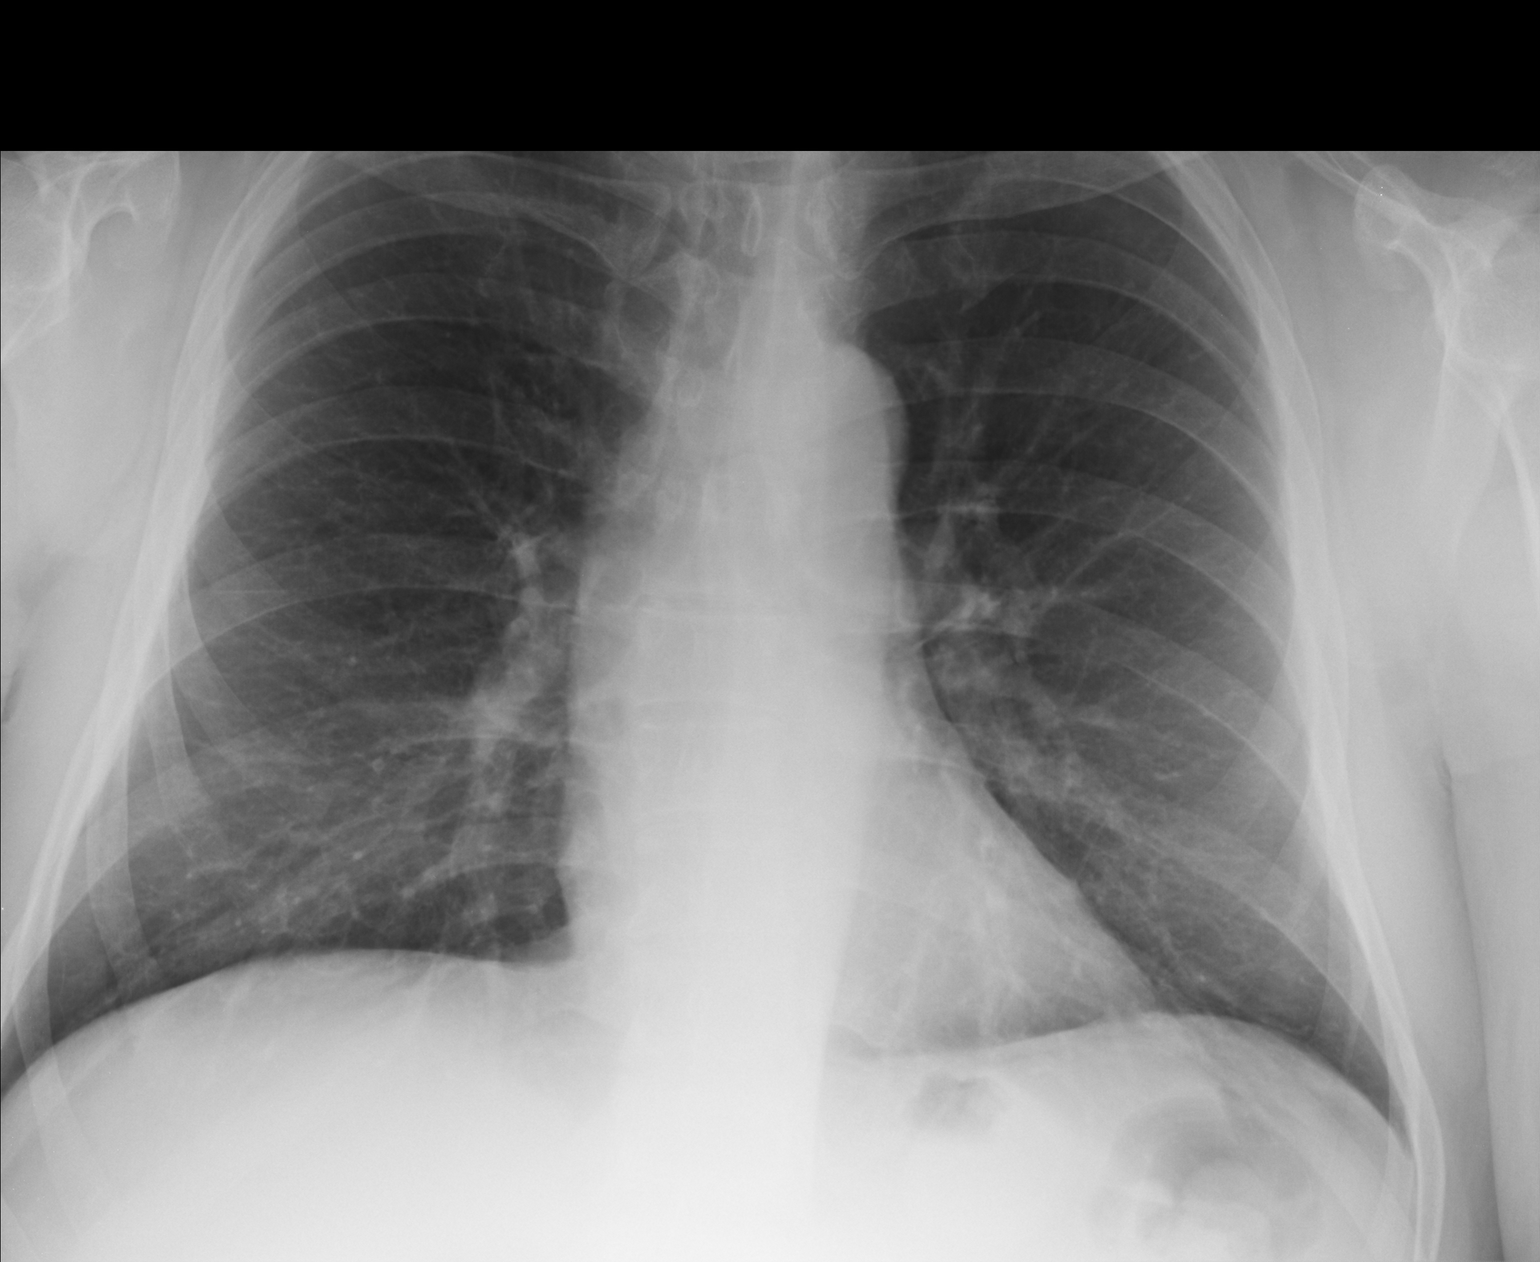

[lateral]
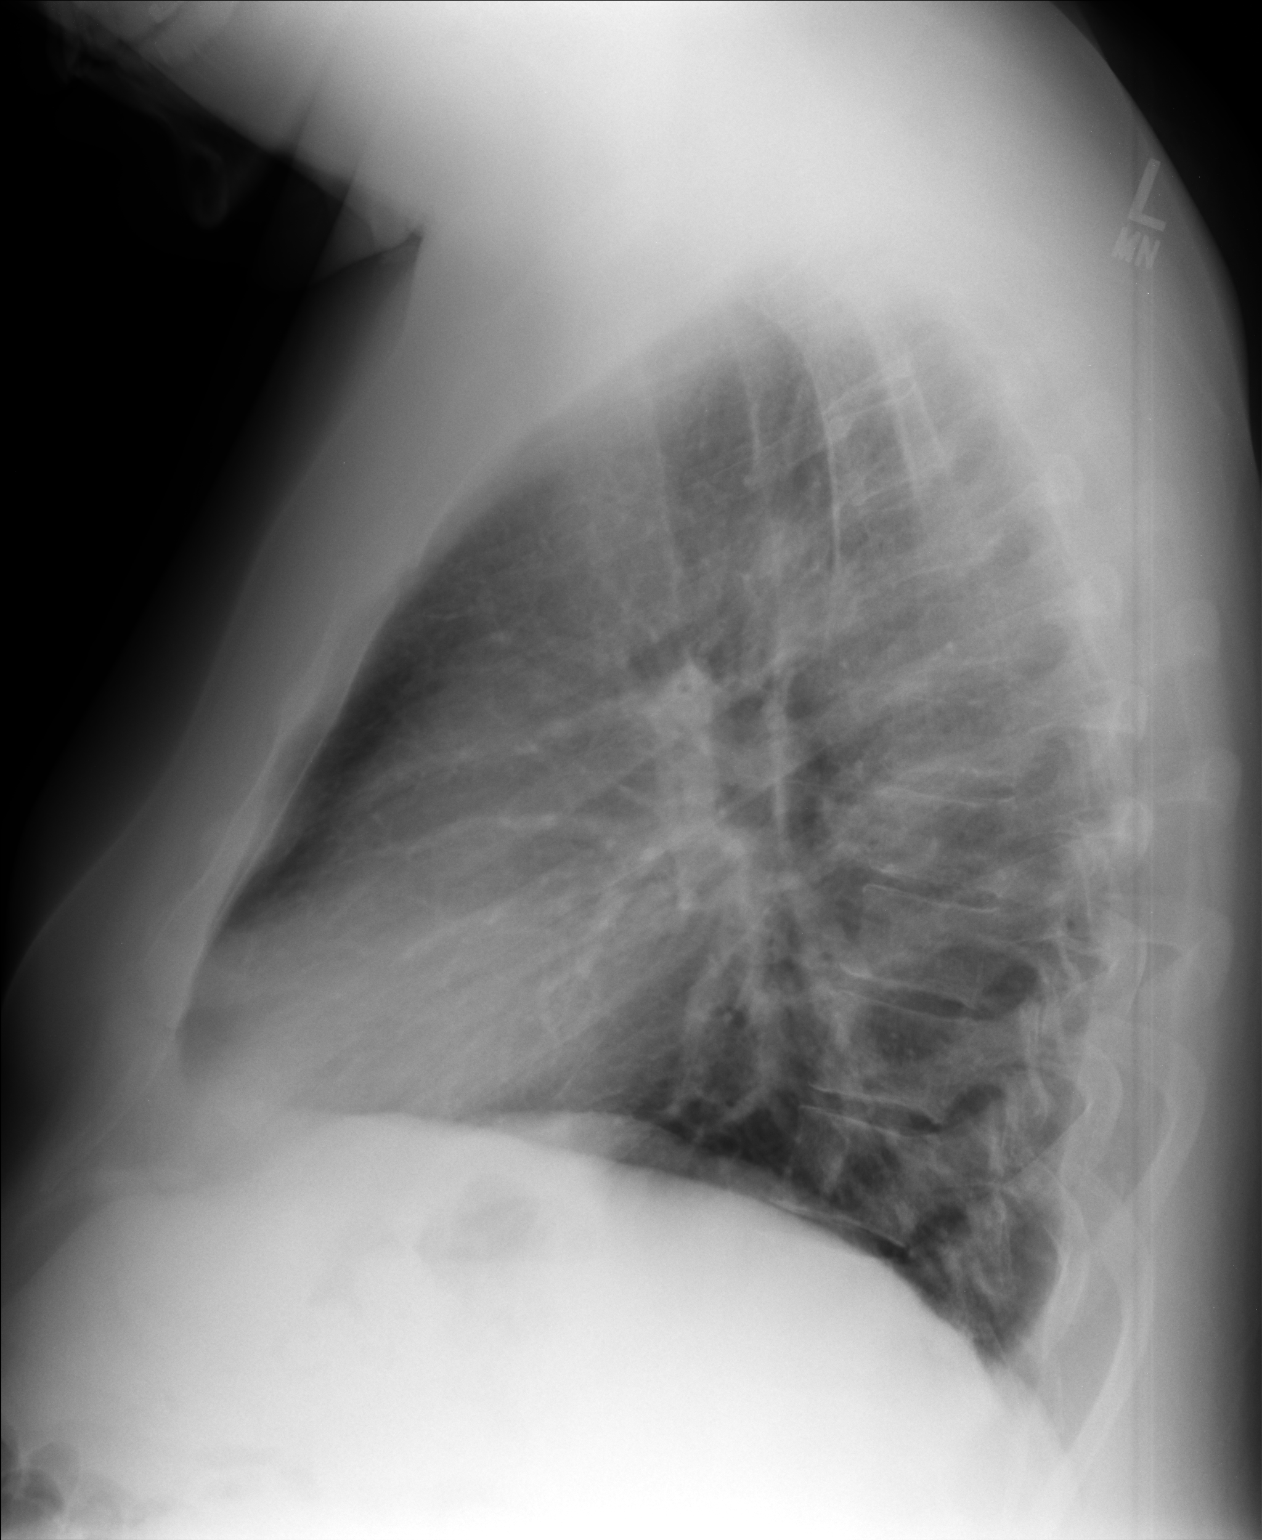

[2 of 2 positions shown; findings below may reference images not displayed]

FINDINGS: Heart size and mediastinal contours are within normal limits. Both
lungs are clear. Visualized skeletal structures are unremarkable.
IMPRESSION: Negative exam.

## 2015-12-08 ENCOUNTER — Ambulatory Visit: Payer: BLUE CROSS/BLUE SHIELD | Admitting: Physical Medicine & Rehabilitation

## 2015-12-11 ENCOUNTER — Ambulatory Visit (HOSPITAL_BASED_OUTPATIENT_CLINIC_OR_DEPARTMENT_OTHER): Payer: BLUE CROSS/BLUE SHIELD | Admitting: Physical Medicine & Rehabilitation

## 2015-12-11 ENCOUNTER — Encounter: Payer: Self-pay | Admitting: Physical Medicine & Rehabilitation

## 2015-12-11 ENCOUNTER — Encounter: Payer: BLUE CROSS/BLUE SHIELD | Attending: Physical Medicine & Rehabilitation

## 2015-12-11 VITALS — BP 156/79 | HR 100 | Resp 14

## 2015-12-11 DIAGNOSIS — M545 Low back pain: Secondary | ICD-10-CM | POA: Insufficient documentation

## 2015-12-11 DIAGNOSIS — M47816 Spondylosis without myelopathy or radiculopathy, lumbar region: Secondary | ICD-10-CM | POA: Diagnosis not present

## 2015-12-11 DIAGNOSIS — M25662 Stiffness of left knee, not elsewhere classified: Secondary | ICD-10-CM | POA: Diagnosis not present

## 2015-12-11 DIAGNOSIS — M961 Postlaminectomy syndrome, not elsewhere classified: Secondary | ICD-10-CM

## 2015-12-11 DIAGNOSIS — G8929 Other chronic pain: Secondary | ICD-10-CM | POA: Insufficient documentation

## 2015-12-11 DIAGNOSIS — M23207 Derangement of unspecified meniscus due to old tear or injury, left knee: Secondary | ICD-10-CM | POA: Diagnosis not present

## 2015-12-11 DIAGNOSIS — M25562 Pain in left knee: Secondary | ICD-10-CM | POA: Insufficient documentation

## 2015-12-11 MED ORDER — TRAMADOL HCL 50 MG PO TABS: 100.0000 mg | ORAL_TABLET | Freq: Four times a day (QID) | ORAL | Status: DC | PRN

## 2015-12-11 NOTE — Patient Instructions (Addendum)
Please walk 30 min per day   May do rowing exercise with theraband  [employer]

## 2015-12-11 NOTE — Progress Notes (Signed)
Subjective:    Patient ID: Zachary Everett, male    DOB: 1951/07/05, 65 y.o.   MRN: [id number]  HPI Started working again.  Some problem with stress, seeing counselor Patient had ED visit for hallucinations. CT was negative. This is felt to be stress related. Patient has no new pain issues. Left knee is doing well. He is walking 2 miles at a time He is doing some theraband exercises for upper extremity strengthening such as deltoids and biceps. We discussed upper back exercises.  Pain Inventory Average Pain 6 Pain Right Now 6 My pain is aching  In the last 24 hours, has pain interfered with the following? General activity 4 Relation with others 4 Enjoyment of life 6 What TIME of day is your pain at its worst? night Sleep (in general) Poor  Pain is worse with: bending, inactivity and standing Pain improves with: pacing activities and medication Relief from Meds: 9  Mobility walk without assistance how many minutes can you walk? 60 ability to climb steps?  yes do you drive?  yes Do you have any goals in this area?  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  no  Physicians involved in your care Any changes since last visit?  no   Family History  Problem Relation Age of Onset  . Kidney disease Mother    Social History   Social History  . Marital Status: Married    Spouse Name: [name]  . Number of Children: 4  . Years of Education: N/A   Occupational History  . Primary school teacher   Social History Main Topics  . Smoking status: Former Smoker    Quit date: 02/23/1986  . Smokeless tobacco: Never Used  . Alcohol Use: 1.8 oz/week    3 Glasses of wine per week  . Drug Use: None  . Sexual Activity: Not Asked   Other Topics Concern  . None   Social History Narrative   Lives with his wife.  All children are adults, and live independently, but within  a mile of the patient.   Past Surgical History  Procedure Laterality Date  . Laparoscopic underlay ventral hernia repair w/ mesh  0000000    PERIUMBILICAL VENTRAL HERNIA  . Laminectomy  1987    L5 - S1  . Circumcision  02/25/2012    Procedure: CIRCUMCISION ADULT;  Surgeon: Claybon Jabs, MD;  Location: [facility];  Service: Urology;  Laterality: N/A;  1 hour requested for this procedure   Past Medical History  Diagnosis Date  . Postlaminectomy syndrome, lumbar region   . Lumbar spondylosis   . Migraines   . Thalassemia minor   . Testosterone deficiency   . Phimosis   . Chronic back pain   . Hyperlipidemia    BP 156/79 mmHg  Pulse 100  Resp 14  SpO2 94%  Opioid Risk Score:   Fall Risk Score:  `1  Depression screen PHQ 2/9  No flowsheet data found.   Review of Systems  All other systems reviewed and are negative.      Objective:   Physical Exam  Constitutional: He is oriented to person, place, and time. He appears well-developed and well-nourished.  HENT:  Head: Normocephalic and atraumatic.  Eyes: Conjunctivae and EOM are normal. Pupils are equal, round, and reactive to light.  Neck: Normal range of motion.  Musculoskeletal:       Lumbar back: He exhibits normal range of motion, no tenderness and no deformity.  Neurological: He is alert and oriented to person, place, and time. He has normal strength. He exhibits normal muscle tone.  Psychiatric: He has a normal mood and affect.  Nursing note and vitals reviewed.   5/5  Bilateral deltoid, biceps, triceps, grip, hip flexor, knee extensor, ankle dorsiflexor and plantar flexor     Assessment & Plan:  1. Chronic low back pain and lumbar spondylosis we discussed injections once again but given his excellent relief with tramadol he like to continue this rather than trying another treatment. We discussed the tramadol is less likely to have side effects than other opioids. He is not having any  chronic constipation. Marland Kitchen He's had no signs of misuse.  Continue 2 tablets 4 times a day Because of the relatively high dose of tramadol, would not recommend SSRIs for anxiety  2. Chronic left knee pain meniscal tear. At this point has some chronic knee swelling but this is really not affecting his function. Occ stiffness Left knee overall much better.    We discussed exercise, recommend fit that or some other exercise tracker

## 2016-01-20 ENCOUNTER — Telehealth: Payer: Self-pay | Admitting: *Deleted

## 2016-01-20 NOTE — Telephone Encounter (Signed)
Mr Zachary Everett called and he is going out of town on Thursday (with kids) and he cannot fill his tramadol until 01/25/16.  He is asking for permission to fill before he leaves. I have given pharmacy ok to fill on 01/22/16 this one time early refill.

## 2016-04-09 ENCOUNTER — Telehealth: Payer: Self-pay

## 2016-04-09 NOTE — Telephone Encounter (Signed)
Per ET- gave authorization to fill early.

## 2016-04-09 NOTE — Telephone Encounter (Signed)
Pt is requesting an early refill on Tramadol. He has a refill on file, pharmacy just needs authorization. He is going out of town for 3 weeks and will not be able to pick up his medications. Please advise. His last fill date according to [facility] was 03/16/16 #240.

## 2016-06-03 ENCOUNTER — Telehealth: Payer: Self-pay | Admitting: Physical Medicine & Rehabilitation

## 2016-06-03 NOTE — Telephone Encounter (Signed)
We can refill one month. He has an appt 06/11/16 with Dr Letta Pate. Last filled 05/07/16

## 2016-06-03 NOTE — Telephone Encounter (Signed)
Robin with [facility] needs to get a refill on Tramadol for patient.   Any questions please call her at [phone].

## 2016-06-04 MED ORDER — TRAMADOL HCL 50 MG PO TABS: 100.0000 mg | ORAL_TABLET | Freq: Four times a day (QID) | ORAL | 0 refills | Status: DC | PRN

## 2016-06-11 ENCOUNTER — Ambulatory Visit: Payer: Self-pay | Admitting: Physical Medicine & Rehabilitation

## 2016-07-01 ENCOUNTER — Encounter: Payer: Self-pay | Admitting: Physical Medicine & Rehabilitation

## 2016-07-01 ENCOUNTER — Ambulatory Visit (HOSPITAL_BASED_OUTPATIENT_CLINIC_OR_DEPARTMENT_OTHER): Payer: Medicare Other | Admitting: Physical Medicine & Rehabilitation

## 2016-07-01 ENCOUNTER — Encounter: Payer: Medicare Other | Attending: Physical Medicine & Rehabilitation

## 2016-07-01 VITALS — BP 150/96 | HR 93 | Resp 14

## 2016-07-01 DIAGNOSIS — Z79899 Other long term (current) drug therapy: Secondary | ICD-10-CM | POA: Diagnosis not present

## 2016-07-01 DIAGNOSIS — G8929 Other chronic pain: Secondary | ICD-10-CM | POA: Diagnosis present

## 2016-07-01 DIAGNOSIS — M47816 Spondylosis without myelopathy or radiculopathy, lumbar region: Secondary | ICD-10-CM

## 2016-07-01 DIAGNOSIS — M545 Low back pain: Secondary | ICD-10-CM | POA: Diagnosis not present

## 2016-07-01 DIAGNOSIS — M961 Postlaminectomy syndrome, not elsewhere classified: Secondary | ICD-10-CM | POA: Diagnosis not present

## 2016-07-01 DIAGNOSIS — M47896 Other spondylosis, lumbar region: Secondary | ICD-10-CM | POA: Diagnosis not present

## 2016-07-01 DIAGNOSIS — Z87891 Personal history of nicotine dependence: Secondary | ICD-10-CM | POA: Insufficient documentation

## 2016-07-01 MED ORDER — TRAMADOL HCL 50 MG PO TABS: 100.0000 mg | ORAL_TABLET | Freq: Four times a day (QID) | ORAL | 5 refills | Status: DC | PRN

## 2016-07-01 NOTE — Progress Notes (Signed)
Subjective:    Patient ID: Zachary Everett, male    DOB: 04-May-1951, 65 y.o.   MRN: [id number]  HPI On medicare, starting Part D On [place] On [employer] Dr Huel Cote psych, Patient states that these new medications are helping him to a great degree. 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 Pain is holding up pretty well. He feels that the work schedule is not excessive, but it is inconsistent  Pain Inventory Average Pain 6 Pain Right Now 6 My pain is sharp and dull  In the last 24 hours, has pain interfered with the following? General activity 4 Relation with others 4 Enjoyment of life 4 What TIME of day is your pain at its worst? night Sleep (in general) NA  Pain is worse with: bending Pain improves with: medication Relief from Meds: n/a  Mobility walk without assistance how many minutes can you walk? 30 ability to climb steps?  yes do you drive?  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  no  Physicians involved in your care Any changes since last visit?  no   Family History  Problem Relation Age of Onset  . Kidney disease Mother    Social History   Social History  . Marital status: Married    Spouse name: [name]  . Number of children: 4  . Years of education: N/A   Occupational History  . [place] History Main Topics  . Smoking status: Former Smoker    Quit date: 02/23/1986  . Smokeless tobacco: Never Used  . Alcohol use 1.8 oz/week    3 Glasses of wine per week  . Drug use: Unknown  . Sexual activity: Not Asked   Other Topics Concern  . None   Social History Narrative   Lives with his wife.  All children are adults,  and live independently, but within a mile of the patient.   Past Surgical History:  Procedure Laterality Date  . CIRCUMCISION  02/25/2012   Procedure: CIRCUMCISION ADULT;  Surgeon: Claybon Jabs, MD;  [facility]  Service: Urology;  Laterality: N/A;  1 hour requested for this procedure  . LAMINECTOMY  1987   L5 - S1  . LAPAROSCOPIC UNDERLAY VENTRAL HERNIA REPAIR W/ MESH  0000000   PERIUMBILICAL VENTRAL HERNIA   Past Medical History:  Diagnosis Date  . Chronic back pain   . Hyperlipidemia   . Lumbar spondylosis   . Migraines   . Phimosis   . Postlaminectomy syndrome, lumbar region   . Testosterone deficiency   . Thalassemia minor    BP (!) 150/96   Pulse 93   Resp 14   SpO2 95%   Opioid Risk Score:   Fall Risk Score:  `1  Depression screen PHQ 2/9  Depression screen PHQ 2/9 07/01/2016  Decreased Interest 0  Down, Depressed, Hopeless 0  PHQ - 2 Score 0    Review of Systems  All other systems reviewed and are negative.      Objective:   Physical Exam  Constitutional: He is oriented to person, place, and time. He appears well-developed and well-nourished.  HENT:  Head:  Normocephalic and atraumatic.  Eyes: Conjunctivae and EOM are normal. Pupils are equal, round, and reactive to light.  Neck: Normal range of motion.  Musculoskeletal:       Right hip: Normal.       Left hip: Normal.       Lumbar back: He exhibits tenderness. He exhibits normal range of motion and no deformity.  Neurological: He is alert and oriented to person, place, and time. Gait normal.  5/5 bilateral hip flexors, knee extensors, ankle dorsiflexors Negative straight leg raising test  Skin: Skin is warm and dry.  Psychiatric: He has a normal mood and affect.  Nursing note and vitals reviewed.         Assessment & Plan:  1. Chronic low back pain, lumbar post laminectomy syndrome as well as history lumbar spondylosis. No radicular symptoms. Overall functioning  at a independent level. This pain is well managed by tramadol 100 mg 4 times a day We discussed the possibility of serotonin syndrome and have given him some materials on this. He is on both [place] and Trintillex  Currently no symptoms. Went over the handout He will notice  [place] and Psychiatry if he develops any symptoms sweating or increased anxiety etc  RTC 6 mo

## 2016-07-01 NOTE — Patient Instructions (Signed)
Serotonin Syndrome  Serotonin is a brain chemical that regulates the nervous system, which includes the brain, spinal cord, and nerves. Serotonin appears to play a role in all types of behavior, including appetite, emotions, movement, thinking, and response to stress. Excessively high levels of serotonin in the body can cause serotonin syndrome, which is a very dangerous condition.  CAUSES  This condition can be caused by taking medicines or drugs that increase the level of serotonin in your body. These include:  · Antidepressant medicines.  · Migraine medicines.  · Certain pain medicines.  · Certain recreational drugs, including ecstasy, LSD, cocaine, and amphetamines.  · Over-the-counter cough or cold medicines that contain dextromethorphan.  · Certain herbal supplements, including St. John's wort, ginseng, and nutmeg.  This condition usually occurs when you take these medicines or drugs in combination, but it can also happen with a high dose of a single medicine or drug.  RISK FACTORS  This condition is more likely to develop in:  · People who have recently increased the dosage of medicine that increases the serotonin level.  · People who just started taking medicine that increases the serotonin level.  SYMPTOMS  Symptoms of this condition usually happens within several hours of a medicine change. Symptoms include:  · Headache.  · Muscle twitching or stiffness.  · Diarrhea.  · Confusion.  · Restlessness or agitation.  · Shivering or goose bumps.  · Loss of muscle coordination.  · Rapid heart rate.  · Sweating.  Severe cases of serotonin syndrome can cause:  · Irregular heartbeat.  · Seizures.  · Loss of consciousness.  · High fever.  DIAGNOSIS  This condition is diagnosed with a medical history and physical exam. You will be asked about your symptoms and your use of medicines and recreational drugs. Your health care provider may also order lab work or additional tests to rule out other causes of your  symptoms.  TREATMENT  The treatment for this condition depends on the severity of your symptoms. For mild cases, stopping the medicine that caused your condition is usually all that is needed. For moderate to severe cases, hospitalization is required to monitor you and to prevent further muscle damage.  HOME CARE INSTRUCTIONS  · Take over-the-counter and prescription medicines only as told by your health care provider. This is important.  · Check with your health care provider before you start taking any new prescriptions, over-the-counter medicines, herbs, or supplements.  · Avoid combining any medicines that can cause this condition to occur.  · Keep all follow-up visits as told by your health care provider. This is important.  · Maintain a healthy lifestyle.    Eat healthy foods.    Get plenty of sleep.    Exercise regularly.    Do not drink alcohol.    Do not use recreational drugs.  SEEK MEDICAL CARE IF:  · Medicines do not seem to be helping.  · Your symptoms do not improve or they get worse.  · You have trouble taking care of yourself.  SEEK IMMEDIATE MEDICAL CARE IF:  · You have worsening confusion, severe headache, chest pain, high fever, seizures, or loss of consciousness.  · You have serious thoughts about hurting yourself or others.  · You experience serious side effects of medicine, such as swelling of your face, lips, tongue, or throat.     This information is not intended to replace advice given to you by your health care provider. Make sure you discuss any questions you   have with your health care provider.     Document Released: 10/28/2004 Document Revised: 02/04/2015 Document Reviewed: 10/03/2014  Elsevier Interactive Patient Education ©2016 Elsevier Inc.

## 2016-11-01 ENCOUNTER — Encounter (HOSPITAL_COMMUNITY): Payer: Self-pay | Admitting: Emergency Medicine

## 2016-11-01 ENCOUNTER — Emergency Department (HOSPITAL_COMMUNITY)
Admission: EM | Admit: 2016-11-01 | Discharge: 2016-11-01 | Disposition: A | Discharge status: Home / Self Care | Payer: Medicare Other | Attending: Emergency Medicine | Admitting: Emergency Medicine

## 2016-11-01 DIAGNOSIS — Z7982 Long term (current) use of aspirin: Secondary | ICD-10-CM | POA: Insufficient documentation

## 2016-11-01 DIAGNOSIS — Z87891 Personal history of nicotine dependence: Secondary | ICD-10-CM | POA: Insufficient documentation

## 2016-11-01 DIAGNOSIS — R112 Nausea with vomiting, unspecified: Secondary | ICD-10-CM | POA: Diagnosis not present

## 2016-11-01 DIAGNOSIS — Z79899 Other long term (current) drug therapy: Secondary | ICD-10-CM | POA: Insufficient documentation

## 2016-11-01 DIAGNOSIS — R197 Diarrhea, unspecified: Secondary | ICD-10-CM | POA: Diagnosis not present

## 2016-11-01 LAB — COMPREHENSIVE METABOLIC PANEL
ALBUMIN: 4.5 g/dL (ref 3.5–5.0)
ALK PHOS: 94 U/L (ref 38–126)
ALT: 21 U/L (ref 17–63)
ANION GAP: 11 (ref 5–15)
AST: 22 U/L (ref 15–41)
BILIRUBIN TOTAL: 1 mg/dL (ref 0.3–1.2)
BUN: 17 mg/dL (ref 6–20)
CALCIUM: 9.4 mg/dL (ref 8.9–10.3)
CO2: 26 mmol/L (ref 22–32)
CREATININE: 0.77 mg/dL (ref 0.61–1.24)
Chloride: 104 mmol/L (ref 101–111)
GFR calc non Af Amer: >60 mL/min (ref >60)
GLUCOSE: 113 mg/dL — AB (ref 65–99)
Potassium: 3.9 mmol/L (ref 3.5–5.1)
Sodium: 141 mmol/L (ref 135–145)
TOTAL PROTEIN: 8.2 g/dL — AB (ref 6.5–8.1)

## 2016-11-01 LAB — URINALYSIS, ROUTINE W REFLEX MICROSCOPIC
BILIRUBIN URINE: NEGATIVE
Glucose, UA: NEGATIVE mg/dL
Hgb urine dipstick: NEGATIVE
KETONES UR: NEGATIVE mg/dL
Leukocytes, UA: NEGATIVE
NITRITE: NEGATIVE
PROTEIN: NEGATIVE mg/dL
SPECIFIC GRAVITY, URINE: 1.025 (ref 1.005–1.030)
pH: 7 (ref 5.0–8.0)

## 2016-11-01 LAB — LIPASE, BLOOD: Lipase: 23 U/L (ref 11–51)

## 2016-11-01 LAB — CBC
HCT: 39.7 % (ref 39.0–52.0)
HEMOGLOBIN: 12.7 g/dL — AB (ref 13.0–17.0)
MCH: 19.2 pg — ABNORMAL LOW (ref 26.0–34.0)
MCHC: 32 g/dL (ref 30.0–36.0)
MCV: 60 fL — ABNORMAL LOW (ref 78.0–100.0)
PLATELETS: 204 10*3/uL (ref 150–400)
RBC: 6.62 MIL/uL — ABNORMAL HIGH (ref 4.22–5.81)
RDW: 16.9 % — ABNORMAL HIGH (ref 11.5–15.5)
WBC: 11.2 10*3/uL — ABNORMAL HIGH (ref 4.0–10.5)

## 2016-11-01 MED ORDER — ONDANSETRON 4 MG PO TBDP: 4.0000 mg | ORAL_TABLET | Freq: Three times a day (TID) | ORAL | 0 refills | Status: AC | PRN

## 2016-11-01 MED ORDER — SODIUM CHLORIDE 0.9 % IV BOLUS (SEPSIS)
1000.0000 mL | Freq: Once | INTRAVENOUS | Status: AC
  Administered 2016-11-01: 1000 mL via INTRAVENOUS

## 2016-11-01 MED ORDER — ONDANSETRON HCL 4 MG/2ML IJ SOLN
4.0000 mg | Freq: Once | INTRAMUSCULAR | Status: AC
  Administered 2016-11-01: 4 mg via INTRAVENOUS
  Filled 2016-11-01: qty 2

## 2016-11-01 NOTE — ED Triage Notes (Signed)
Per EMS. Pt began to have n/v/d and diffuse abd pain at 0730 this am. Zachary Everett to PCP office. Was found to have near syncopal episodes upon standing. Had chills this am, but no cough/flu-like symptoms.

## 2016-11-01 NOTE — ED Provider Notes (Signed)
[facility] DEPT Provider Note   CSN: [id number] Arrival date & time: 11/01/16  1311     History   Chief Complaint Chief Complaint  Patient presents with  . Emesis  . Diarrhea    HPI Zachary Everett is a 66 y.o. male.  The history is provided by the patient and the spouse.  GI Problem  This is a new problem. The current episode started 6 to 12 hours ago. The problem occurs hourly. The problem has not changed since onset.Associated symptoms include abdominal pain. Pertinent negatives include no chest pain, no headaches and no shortness of breath. Nothing aggravates the symptoms. Nothing relieves the symptoms. He has tried nothing for the symptoms.   Reports possible sick contacts with similar symptoms. Also reports eating sushi last night. No recent travel, or Abx.  Has not tried to drink since emesis began.    Past Medical History:  Diagnosis Date  . Chronic back pain   . Hyperlipidemia   . Lumbar spondylosis   . Migraines   . Phimosis   . Postlaminectomy syndrome, lumbar region   . Testosterone deficiency   . Thalassemia minor     Patient Active Problem List   Diagnosis Date Noted  . Left knee pain 12/06/2014  . Elevated fasting blood sugar 11/28/2012  . BP (high blood pressure) 11/28/2012  . Phimosis 02/24/2012  . Phimosis/adherent prepuce 02/24/2012  . Postlaminectomy syndrome, lumbar region 01/03/2012  . Lumbar spondylosis 01/03/2012  . HLD (hyperlipidemia) 09/30/2011  . Hypotestosteronism 09/30/2011  . Back ache 11/30/2010  . Anxiety, generalized 11/30/2010  . Psychosexual dysfunction with inhibited sexual excitement 11/30/2010  . Headache, migraine 05/08/2010  . Acne erythematosa 05/08/2010  . Malaise and fatigue 01/05/2010  . Thalassanemia 01/05/2010    Past Surgical History:  Procedure Laterality Date  . CIRCUMCISION  02/25/2012   Procedure: CIRCUMCISION ADULT;  Surgeon: Claybon Jabs, MD;  Location: [facility];  Service:  Urology;  Laterality: N/A;  1 hour requested for this procedure  . LAMINECTOMY  1987   L5 - S1  . LAPAROSCOPIC UNDERLAY VENTRAL HERNIA REPAIR W/ MESH  0000000   PERIUMBILICAL VENTRAL HERNIA       Home Medications    Prior to Admission medications   Medication Sig Start Date End Date Taking? Authorizing Provider  ALPRAZolam Duanne Moron) 0.5 MG tablet Take 0.5 mg by mouth at bedtime as needed for anxiety.  10/18/13   Historical Provider, MD  aspirin-acetaminophen-caffeine (EXCEDRIN MIGRAINE) [phone] MG tablet Take 2 tablets by mouth every 6 (six) hours as needed for headache.    Historical Provider, MD  atorvastatin (LIPITOR) 20 MG tablet TAKE 1 TABLET (20 MG TOTAL) BY MOUTH DAILY. 10/28/14   Historical Provider, MD  CALCIUM PO Take 2 tablets by mouth daily.    Historical Provider, MD  diclofenac sodium (VOLTAREN) 1 % GEL Apply 2 g topically 4 (four) times daily. 07/11/12   Charlett Blake, MD  FLUARIX QUADRIVALENT 0.5 ML injection FLU SHOT 07/18/15   Historical Provider, MD  ibuprofen (ADVIL,MOTRIN) 200 MG tablet Take 400 mg by mouth every 6 (six) hours as needed for moderate pain.    Historical Provider, MD  ketoconazole (NIZORAL) 2 % cream  06/07/12   Historical Provider, MD  LATUDA 40 MG TABS tablet Take 20 mg by mouth daily. 05/16/16   Historical Provider, MD  MAGNESIUM PO Take 1 tablet by mouth daily.    Historical Provider, MD  ondansetron (ZOFRAN ODT) 4 MG disintegrating tablet Take  1 tablet (4 mg total) by mouth every 8 (eight) hours as needed for nausea or vomiting. 11/01/16 11/06/16  Fatima Blank, MD  Riboflavin (VITAMIN B-2 PO) Take 1 tablet by mouth at bedtime.    Historical Provider, MD  Testosterone (ANDROGEL PUMP) 20.25 MG/ACT (1.62%) GEL APPLY 4 PUMPS ONTO THE SKIN DAILY AS DIRECTED 08/18/15   Historical Provider, MD  traMADol (ULTRAM) 50 MG tablet Take 2 tablets (100 mg total) by mouth every 6 (six) hours as needed. 07/01/16   Charlett Blake, MD  vortioxetine HBr  (TRINTELLIX) 10 MG TABS Take 1 tablet by mouth daily.    Historical Provider, MD    Family History Family History  Problem Relation Age of Onset  . Kidney disease Mother     Social History Social History  Substance Use Topics  . Smoking status: Former Smoker    Quit date: 02/23/1986  . Smokeless tobacco: Never Used  . Alcohol use 1.8 oz/week    3 Glasses of wine per week     Allergies   Other and Penicillins   Review of Systems Review of Systems  Constitutional: Negative for chills and fever.  HENT: Positive for rhinorrhea. Negative for ear pain and sore throat.   Eyes: Negative for pain and visual disturbance.  Respiratory: Negative for cough and shortness of breath.   Cardiovascular: Negative for chest pain and palpitations.  Gastrointestinal: Positive for abdominal pain, diarrhea (NB) and vomiting (NBNB).  Genitourinary: Negative for dysuria and hematuria.  Musculoskeletal: Positive for myalgias. Negative for arthralgias and back pain.  Skin: Negative for color change and rash.  Neurological: Negative for seizures, syncope and headaches.  All other systems reviewed and are negative.    Physical Exam Updated Vital Signs BP 147/86   Pulse 104   Temp 97.7 F (36.5 C) (Oral)   Resp 18   SpO2 97%   Physical Exam  Constitutional: He is oriented to person, place, and time. He appears well-developed and well-nourished. No distress.  HENT:  Head: Normocephalic and atraumatic.  Nose: Nose normal.  Eyes: Conjunctivae and EOM are normal. Pupils are equal, round, and reactive to light. Right eye exhibits no discharge. Left eye exhibits no discharge. No scleral icterus.  Neck: Normal range of motion. Neck supple.  Cardiovascular: Normal rate and regular rhythm.  Exam reveals no gallop and no friction rub.   No murmur heard. Pulmonary/Chest: Effort normal and breath sounds normal. No stridor. No respiratory distress. He has no rales.  Abdominal: Soft. He exhibits no  distension. There is no tenderness. There is no rebound, no guarding and no CVA tenderness.  Musculoskeletal: He exhibits no edema or tenderness.  Neurological: He is alert and oriented to person, place, and time.  Skin: Skin is warm and dry. No rash noted. He is not diaphoretic. No erythema.  Psychiatric: He has a normal mood and affect.  Vitals reviewed.    ED Treatments / Results  Labs (all labs ordered are listed, but only abnormal results are displayed) Labs Reviewed  COMPREHENSIVE METABOLIC PANEL - Abnormal; Notable for the following:       Result Value   Glucose, Bld 113 (*)    Total Protein 8.2 (*)    All other components within normal limits  CBC - Abnormal; Notable for the following:    WBC 11.2 (*)    RBC 6.62 (*)    Hemoglobin 12.7 (*)    MCV 60.0 (*)    MCH 19.2 (*)  RDW 16.9 (*)    All other components within normal limits  LIPASE, BLOOD  URINALYSIS, ROUTINE W REFLEX MICROSCOPIC    EKG  EKG Interpretation None       Radiology No results found.  Procedures Procedures (including critical care time)  Medications Ordered in ED Medications  sodium chloride 0.9 % bolus 1,000 mL (1,000 mLs Intravenous New Bag/Given 11/01/16 1424)  ondansetron (ZOFRAN) injection 4 mg (4 mg Intravenous Given 11/01/16 1423)     Initial Impression / Assessment and Plan / ED Course  I have reviewed the triage vital signs and the nursing notes.  Pertinent labs & imaging results that were available during my care of the patient were reviewed by me and considered in my medical decision making (see chart for details).     66 y.o. male presents with vomiting, diarrhea, and myalgias since this am. Possible suspicious food intake.  Inadequate oral tolerance. Rest of history as above.  Patient appears well, not in distress, and with no signs of toxicity or dehydration. Abdomen benign.  Rest of the exam as above  Most consistent with viral gastroenteritis vs food poisoning.     Doubt appendicitis, diverticulitis, severe colitis, dysentery.    Given IVF and zofran. Labs reassuring.  Able to tolerate oral intake in the ED.  Discussed symptomatic treatment with the parents and they will follow closely with their PCP.   Final Clinical Impressions(s) / ED Diagnoses   Final diagnoses:  Nausea vomiting and diarrhea   Disposition: Discharge  Condition: Good  I have discussed the results, Dx and Tx plan with the patient and wife who expressed understanding and agree(s) with the plan. Discharge instructions discussed at great length. The patient and wife was given strict return precautions who verbalized understanding of the instructions. No further questions at time of discharge.    New Prescriptions   ONDANSETRON (ZOFRAN ODT) 4 MG DISINTEGRATING TABLET    Take 1 tablet (4 mg total) by mouth every 8 (eight) hours as needed for nausea or vomiting.    Follow Up: [place] [address], [place] [address] [phone]  Schedule an appointment as soon as possible for a visit  in 3-5 days, If symptoms do not improve or  worsen      Fatima Blank, MD 11/01/16 [phone]

## 2016-11-01 NOTE — ED Notes (Signed)
ED Provider at bedside. 

## 2016-11-01 NOTE — ED Notes (Signed)
Bed: [facility] Expected date:  Expected time:  Means of arrival:  Comments: EMS orthostatic

## 2016-12-22 ENCOUNTER — Other Ambulatory Visit: Payer: Self-pay | Admitting: Physical Medicine & Rehabilitation

## 2016-12-28 ENCOUNTER — Ambulatory Visit (HOSPITAL_BASED_OUTPATIENT_CLINIC_OR_DEPARTMENT_OTHER): Payer: Medicare Other | Admitting: Physical Medicine & Rehabilitation

## 2016-12-28 ENCOUNTER — Encounter: Payer: Self-pay | Admitting: Physical Medicine & Rehabilitation

## 2016-12-28 ENCOUNTER — Encounter: Payer: Medicare Other | Attending: Physical Medicine & Rehabilitation

## 2016-12-28 VITALS — BP 129/81 | HR 84

## 2016-12-28 DIAGNOSIS — M961 Postlaminectomy syndrome, not elsewhere classified: Secondary | ICD-10-CM | POA: Insufficient documentation

## 2016-12-28 DIAGNOSIS — Z841 Family history of disorders of kidney and ureter: Secondary | ICD-10-CM | POA: Insufficient documentation

## 2016-12-28 DIAGNOSIS — M47816 Spondylosis without myelopathy or radiculopathy, lumbar region: Secondary | ICD-10-CM | POA: Diagnosis not present

## 2016-12-28 DIAGNOSIS — Z9889 Other specified postprocedural states: Secondary | ICD-10-CM | POA: Insufficient documentation

## 2016-12-28 DIAGNOSIS — M6530 Trigger finger, unspecified finger: Secondary | ICD-10-CM

## 2016-12-28 DIAGNOSIS — Z87891 Personal history of nicotine dependence: Secondary | ICD-10-CM | POA: Diagnosis not present

## 2016-12-28 NOTE — Progress Notes (Signed)
Subjective:    Patient ID: [facility], male    DOB: Apr 02, 1951, 66 y.o.   MRN: [id number]  HPI  ED visit 11/01/16 Nausea vomiting diarrheaNo recurrence Still golfing, mainly on the driving range lately  No side effects from tramadol Continues with 100 mg 4 times a day  Working 30hr per week At [employer]  Pain Inventory Average Pain 6 Pain Right Now 4 My pain is dull  In the last 24 hours, has pain interfered with the following? General activity 3 Relation with others 0 Enjoyment of life 5 What TIME of day is your pain at its worst? morning and night Sleep (in general) Fair  Pain is worse with: inactivity, standing and some activites Pain improves with: medication Relief from Meds: .  Mobility walk without assistance ability to climb steps?  yes do you drive?  yes  Function employed # of hrs/week 30 retired  Neuro/Psych No problems in this area  Prior Studies Any changes since last visit?  no  Physicians involved in your care Any changes since last visit?  no   Family History  Problem Relation Age of Onset  . Kidney disease Mother    Social History   Social History  . Marital status: Married    Spouse name: [name]  . Number of children: 4  . Years of education: N/A   Occupational History  . [place] History Main Topics  . Smoking status: Former Smoker    Quit date: 02/23/1986  . Smokeless tobacco: Never Used  . Alcohol use 1.8 oz/week    3 Glasses of wine per week  . Drug use: Unknown  . Sexual activity: Not Asked   Other Topics Concern  . None   Social History Narrative   Lives with his wife.  All children are adults, and live independently, but within a mile of the patient.   Past Surgical History:  Procedure Laterality Date  . CIRCUMCISION  02/25/2012   Procedure: CIRCUMCISION ADULT;  Surgeon: Claybon Jabs, MD;  Location: [facility];  Service: Urology;   Laterality: N/A;  1 hour requested for this procedure  . LAMINECTOMY  1987   L5 - S1  . LAPAROSCOPIC UNDERLAY VENTRAL HERNIA REPAIR W/ MESH  [phone]   PERIUMBILICAL VENTRAL HERNIA   Past Medical History:  Diagnosis Date  . Chronic back pain   . Hyperlipidemia   . Lumbar spondylosis   . Migraines   . Phimosis   . Postlaminectomy syndrome, lumbar region   . Testosterone deficiency   . Thalassemia minor    BP 129/81   Pulse 84   SpO2 96%   Opioid Risk Score:   Fall Risk Score:  `1  Depression screen PHQ 2/9  Depression screen PHQ 2/9 07/01/2016  Decreased Interest 0  Down, Depressed, Hopeless 0  PHQ - 2 Score 0    Review of Systems  Constitutional: Negative.   HENT: Negative.   Eyes: Negative.   Respiratory: Negative.   Cardiovascular: Negative.   Gastrointestinal: Negative.   Endocrine: Negative.   Genitourinary: Negative.   Musculoskeletal: Negative.   Skin: Negative.   Allergic/Immunologic: Negative.   Neurological: Negative.   Hematological: Negative.   Psychiatric/Behavioral: Negative.   All other systems reviewed and are negative.      Objective:   Physical Exam  Constitutional: He is oriented to person, place, and time. He appears well-developed and well-nourished.  HENT:  Head: Normocephalic and atraumatic.  Eyes: Conjunctivae and EOM are normal. Pupils are equal, round, and reactive to light.  Musculoskeletal: Normal range of motion.  No tenderness. Palpation along the lumbar paraspinal muscles. He has good lumbar flexion but extension is limited to 25%. Lateral bending is 50% bilaterally.  Neurological: He is alert and oriented to person, place, and time.  Psychiatric: He has a normal mood and affect.  Nursing note and vitals reviewed.         Assessment & Plan:  1. 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 He has maintained good level of activity with 30 hour per week  employment as well as outside activities. No  signs of side effects from medications.  Return to clinic in 6 months.

## 2016-12-28 NOTE — Patient Instructions (Signed)
Trigger Finger Trigger finger (stenosing tenosynovitis) is a condition that causes a finger to get stuck in a bent position. Each finger has a tough, cord-like tissue that connects muscle to bone (tendon), and each tendon is surrounded by a tunnel of tissue (tendon sheath). To move your finger, your tendon needs to slide freely through the sheath. Trigger finger happens when the tendon or the sheath thickens, making it difficult to move your finger. Trigger finger can affect any finger or a thumb. It may affect more than one finger. Mild cases may clear up with rest and medicine. Severe cases require more treatment. What are the causes? Trigger finger is caused by a thickened finger tendon or tendon sheath. The cause of this thickening is not known. What increases the risk? The following factors may make you more likely to develop this condition:  Doing activities that require a strong grip.  Having rheumatoid arthritis, gout, or diabetes.  Being 40-60 years old.  Being a woman.  What are the signs or symptoms? Symptoms of this condition include:  Pain when bending or straightening your finger.  Tenderness or swelling where your finger attaches to the palm of your hand.  A lump in the palm of your hand or on the inside of your finger.  Hearing a popping sound when you try to straighten your finger.  Feeling a popping, catching, or locking sensation when you try to straighten your finger.  Being unable to straighten your finger.  How is this diagnosed? This condition is diagnosed based on your symptoms and a physical exam. How is this treated? This condition may be treated by:  Resting your finger and avoiding activities that make symptoms worse.  Wearing a finger splint to keep your finger in a slightly bent position.  Taking NSAIDs to relieve pain and swelling.  Injecting medicine (steroids) into the tendon sheath to reduce swelling and irritation. Injections may need to be  repeated.  Having surgery to open the tendon sheath. This may be done if other treatments do not work and you cannot straighten your finger. You may need physical therapy after surgery.  Follow these instructions at home:  Use moist heat to help reduce pain and swelling as told by your health care provider.  Rest your finger and avoid activities that make pain worse. Return to normal activities as told by your health care provider.  If you have a splint, wear it as told by your health care provider.  Take over-the-counter and prescription medicines only as told by your health care provider.  Keep all follow-up visits as told by your health care provider. This is important. Contact a health care provider if:  Your symptoms are not improving with home care. Summary  Trigger finger (stenosing tenosynovitis) causes your finger to get stuck in a bent position, and it can make it difficult and painful to straighten your finger.  This condition develops when a finger tendon or tendon sheath thickens.  Treatment starts with resting, wearing a splint, and taking NSAIDs.  In severe cases, surgery to open the tendon sheath may be needed. This information is not intended to replace advice given to you by your health care provider. Make sure you discuss any questions you have with your health care provider. Document Released: 07/10/2004 Document Revised: 08/31/2016 Document Reviewed: 08/31/2016 Elsevier Interactive Patient Education  2017 Elsevier Inc.  

## 2017-04-22 ENCOUNTER — Other Ambulatory Visit: Payer: Self-pay | Admitting: Physical Medicine & Rehabilitation

## 2017-06-30 ENCOUNTER — Encounter: Payer: Self-pay | Admitting: Physical Medicine & Rehabilitation

## 2017-06-30 ENCOUNTER — Ambulatory Visit (HOSPITAL_BASED_OUTPATIENT_CLINIC_OR_DEPARTMENT_OTHER): Payer: Medicare Other | Admitting: Physical Medicine & Rehabilitation

## 2017-06-30 ENCOUNTER — Encounter (INDEPENDENT_AMBULATORY_CARE_PROVIDER_SITE_OTHER): Payer: Self-pay

## 2017-06-30 ENCOUNTER — Encounter: Payer: Medicare Other | Attending: Physical Medicine & Rehabilitation

## 2017-06-30 VITALS — BP 164/81 | HR 89 | Resp 14

## 2017-06-30 DIAGNOSIS — M47816 Spondylosis without myelopathy or radiculopathy, lumbar region: Secondary | ICD-10-CM

## 2017-06-30 DIAGNOSIS — E291 Testicular hypofunction: Secondary | ICD-10-CM | POA: Diagnosis not present

## 2017-06-30 DIAGNOSIS — D563 Thalassemia minor: Secondary | ICD-10-CM | POA: Diagnosis not present

## 2017-06-30 DIAGNOSIS — M961 Postlaminectomy syndrome, not elsewhere classified: Secondary | ICD-10-CM | POA: Diagnosis not present

## 2017-06-30 DIAGNOSIS — Z841 Family history of disorders of kidney and ureter: Secondary | ICD-10-CM | POA: Insufficient documentation

## 2017-06-30 DIAGNOSIS — Z87891 Personal history of nicotine dependence: Secondary | ICD-10-CM | POA: Diagnosis not present

## 2017-06-30 DIAGNOSIS — E785 Hyperlipidemia, unspecified: Secondary | ICD-10-CM | POA: Diagnosis not present

## 2017-06-30 NOTE — Progress Notes (Signed)
Subjective:    Patient ID: GILMORE LIST, male    DOB: 1950-10-24, 66 y.o.   MRN: [id number]  HPI Moved to new home near [facility] Has a larger yard Now uses riding mower Works ~24hrs per wk Some lifting involved Plays golf once a week  Low back pain doing ok, Continues on tramadol 100 g 4 times a day  Reviewed notes from primary care, Seroquel dose has increased, had hallucinations during the summer, but these have resolved   Pain Inventory Average Pain 3 Pain Right Now 0 My pain is stabbing and aching  In the last 24 hours, has pain interfered with the following? General activity 4 Relation with others 0 Enjoyment of life 0 What TIME of day is your pain at its worst? morning, evening  Sleep (in general) Fair  Pain is worse with: some activites Pain improves with: medication Relief from Meds: 10  Mobility walk without assistance how many minutes can you walk? 60 ability to climb steps?  yes do you drive?  yes Do you have any goals in this area?  yes  Function retired  Neuro/Psych No problems in this area  Prior Studies Any changes since last visit?  no  Physicians involved in your care Any changes since last visit?  no   Family History  Problem Relation Age of Onset  . Kidney disease Mother    Social History   Social History  . Marital status: Married    Spouse name: [name]  . Number of children: 4  . Years of education: N/A   Occupational History  . [place] History Main Topics  . Smoking status: Former Smoker    Quit date: 02/23/1986  . Smokeless tobacco: Never Used  . Alcohol use 1.8 oz/week    3 Glasses of wine per week  . Drug use: Unknown  . Sexual activity: Not Asked   Other Topics Concern  . None   Social History Narrative   Lives with his wife.  All children are adults, and live independently, but within a mile of the patient.   Past Surgical History:  Procedure  Laterality Date  . CIRCUMCISION  02/25/2012   Procedure: CIRCUMCISION ADULT;  Surgeon: Claybon Jabs, MD;  Location: [facility];  Service: Urology;  Laterality: N/A;  1 hour requested for this procedure  . LAMINECTOMY  1987   L5 - S1  . LAPAROSCOPIC UNDERLAY VENTRAL HERNIA REPAIR W/ MESH  [phone]   PERIUMBILICAL VENTRAL HERNIA   Past Medical History:  Diagnosis Date  . Chronic back pain   . Hyperlipidemia   . Lumbar spondylosis   . Migraines   . Phimosis   . Postlaminectomy syndrome, lumbar region   . Testosterone deficiency   . Thalassemia minor    BP (!) 164/81   Pulse 89   Resp 14   SpO2 94%   Opioid Risk Score:   Fall Risk Score:  `1  Depression screen PHQ 2/9  Depression screen PHQ 2/9 07/01/2016  Decreased Interest 0  Down, Depressed, Hopeless 0  PHQ - 2 Score 0    Review of Systems  Constitutional: Negative.   HENT: Negative.   Eyes: Negative.   Respiratory: Negative.   Cardiovascular: Negative.   Gastrointestinal: Negative.   Endocrine: Negative.   Genitourinary: Negative.   Musculoskeletal: Positive for back pain.  Skin: Negative.   Allergic/Immunologic: Negative.   Neurological: Negative.   Hematological:  Negative.   Psychiatric/Behavioral: Negative.        Objective:   Physical Exam  Constitutional: He is oriented to person, place, and time. He appears well-developed and well-nourished.  HENT:  Head: Normocephalic and atraumatic.  Musculoskeletal:       Lumbar back: He exhibits decreased range of motion.  Full lumbar flexion. Lumbar extension limited to 25% of normal Lumbar rotation is normal Negative straight leg raising  Neurological: He is alert and oriented to person, place, and time. He exhibits normal muscle tone. Coordination and gait normal.  Lower extremity strength is 5/5 bilateral flexor, knee extensor, ankle dorsiflexors  Psychiatric: He has a normal mood and affect. His behavior is normal. Judgment and thought  content normal.  Nursing note and vitals reviewed.         Assessment & Plan:  1. Lumbar postlaminectomy syndrome with history. Lumbar spondylosis. Continue tramadol 100 mg 4 times a day this is allowed him to remain functional working as well as doing all of his yardwork.

## 2017-07-17 ENCOUNTER — Other Ambulatory Visit: Payer: Self-pay | Admitting: Physical Medicine & Rehabilitation

## 2017-07-18 ENCOUNTER — Other Ambulatory Visit: Payer: Self-pay

## 2017-09-27 IMAGING — CT CT HEAD W/O CM
2 series · 16 of 30 positions shown, 20 images · non-contrast
Comparison: None.

CLINICAL DATA: Altered mental status with visual hallucinations.
Initial encounter.

EXAM:
CT HEAD WITHOUT CONTRAST
TECHNIQUE: Contiguous axial images were obtained from the base of the skull
through the vertex without intravenous contrast.

[Series 2: head w/o · axial · non-contrast · 0.45mm/px · z∈[+1306,+1441]mm · 13 of 33 slices shown, 17 images]
[im 3/33  brain]
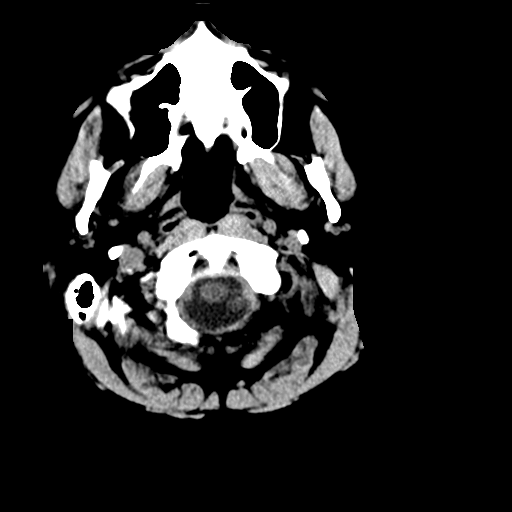
[im 3/33  bone]
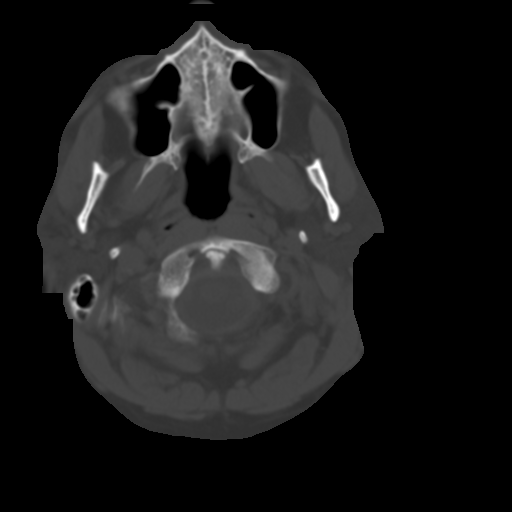
[im 5/33  brain]
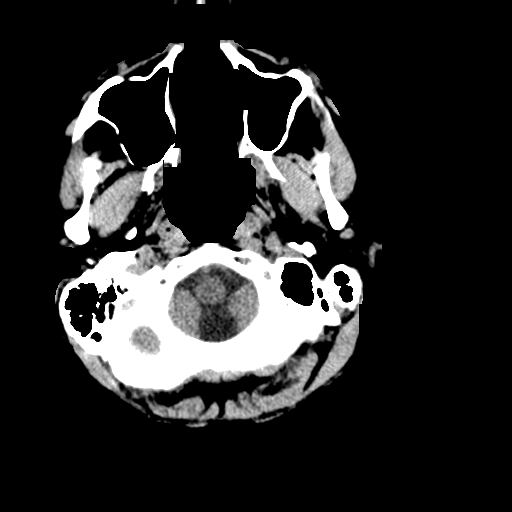
[im 7/33  brain]
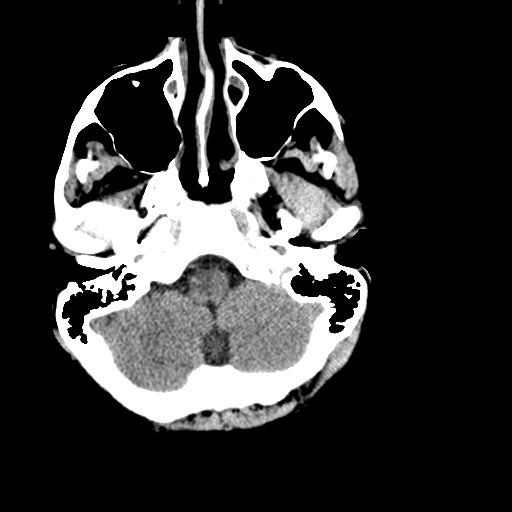
[im 10/33  brain]
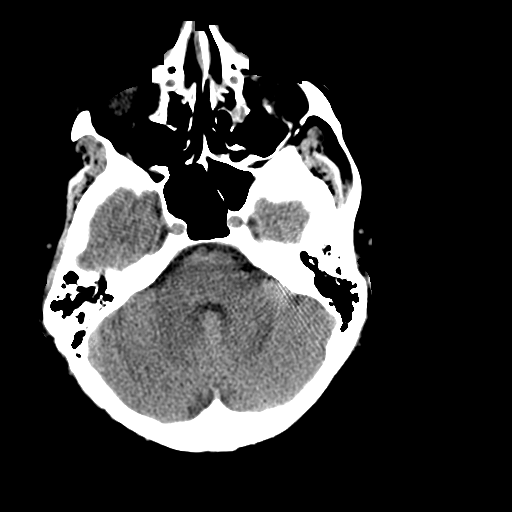
[im 12/33  brain]
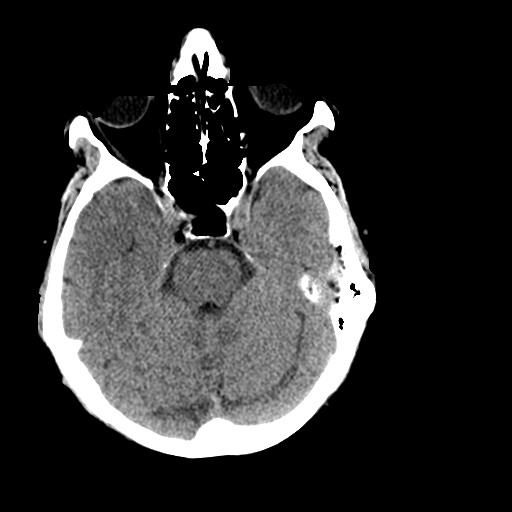
[im 12/33  bone]
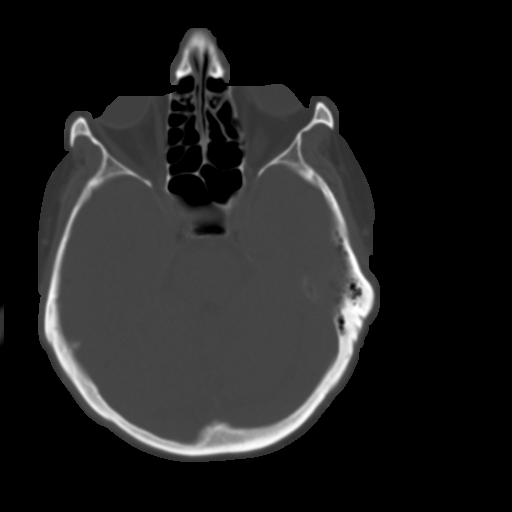
[im 14/33  brain]
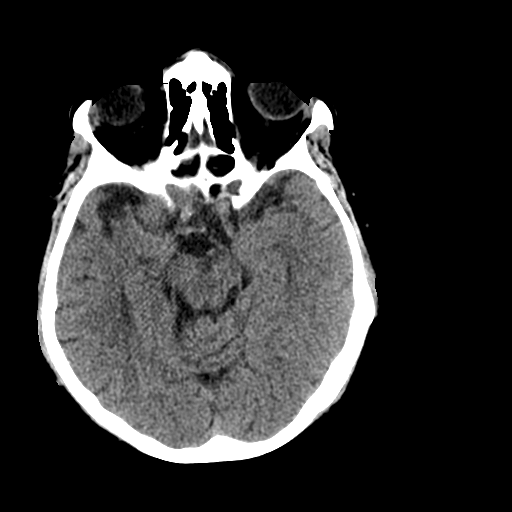
[im 17/33  brain]
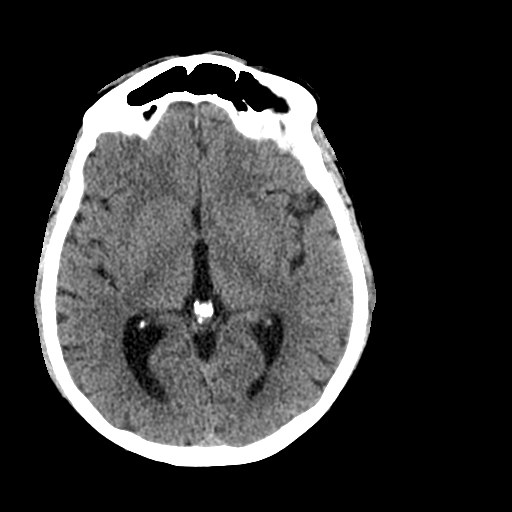
[im 19/33  brain]
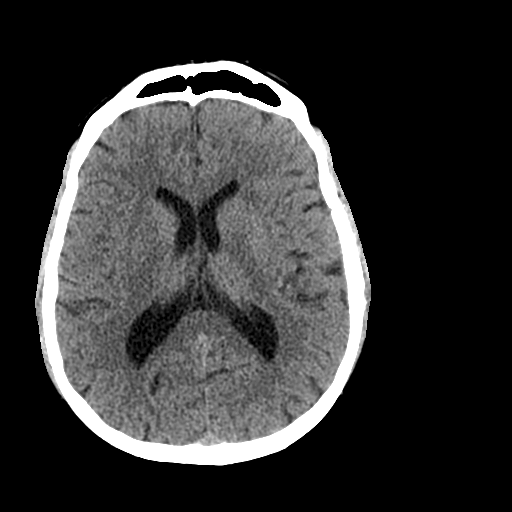
[im 21/33  brain]
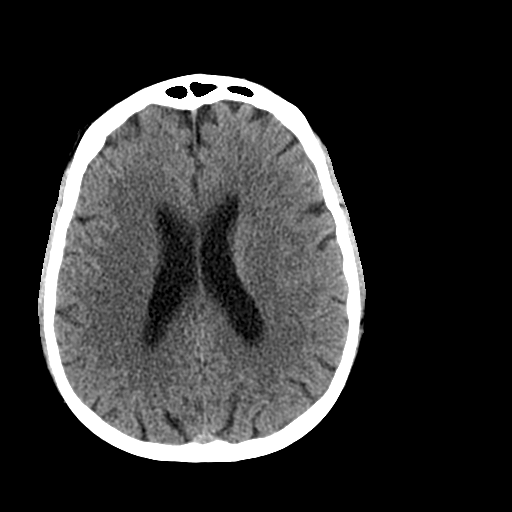
[im 21/33  bone]
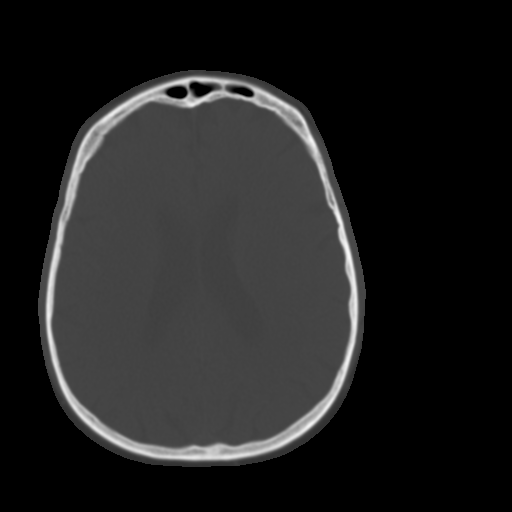
[im 23/33  brain]
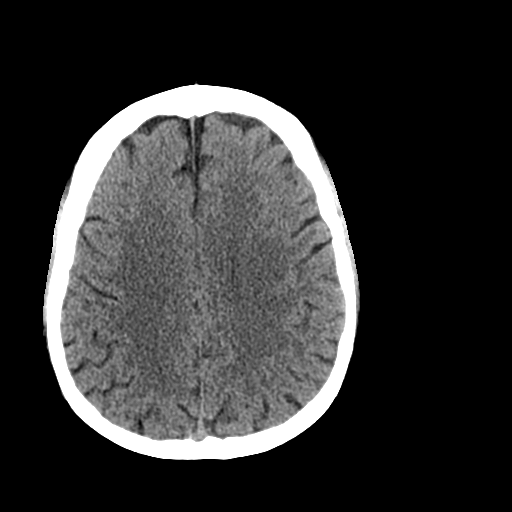
[im 26/33  brain]
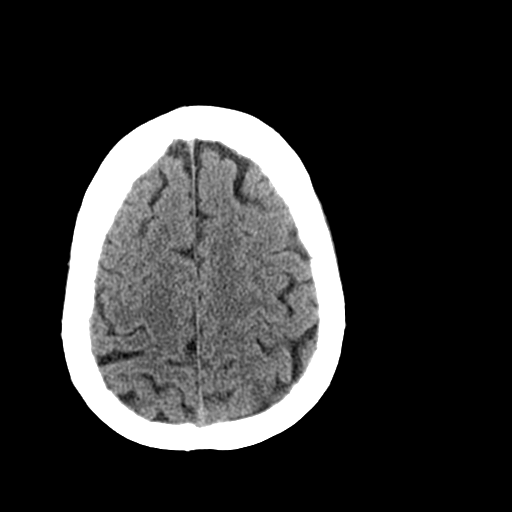
[im 28/33  brain]
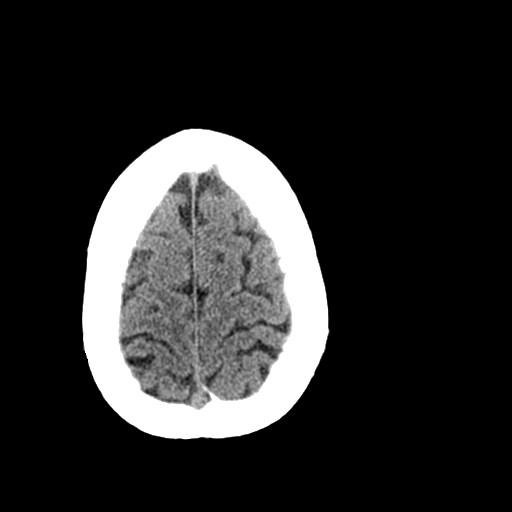
[im 30/33  brain]
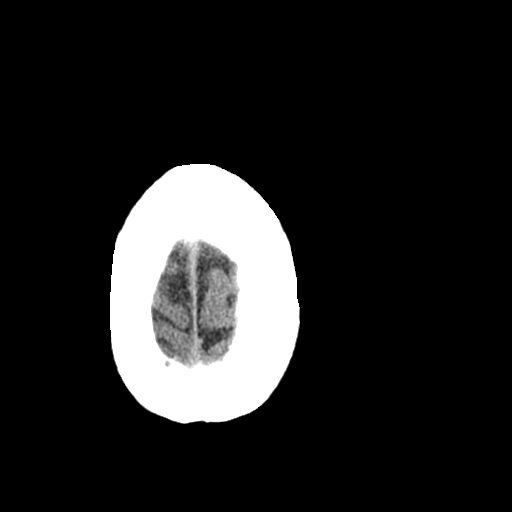
[im 30/33  bone]
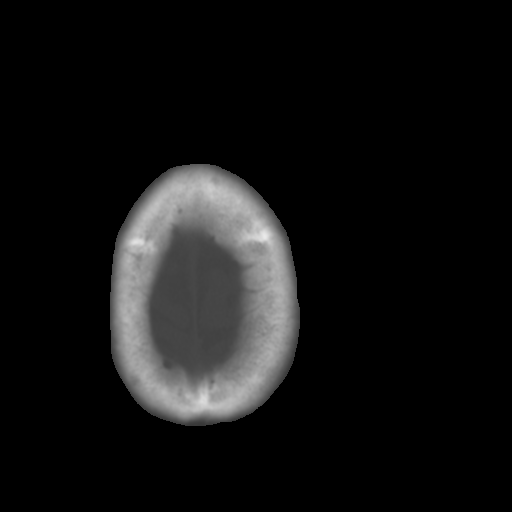

[Series 3: bone windows · axial · 0.45mm/px · z∈[+1306,+1351]mm · 3 of 33 slices shown]
[im 3/33  bone]
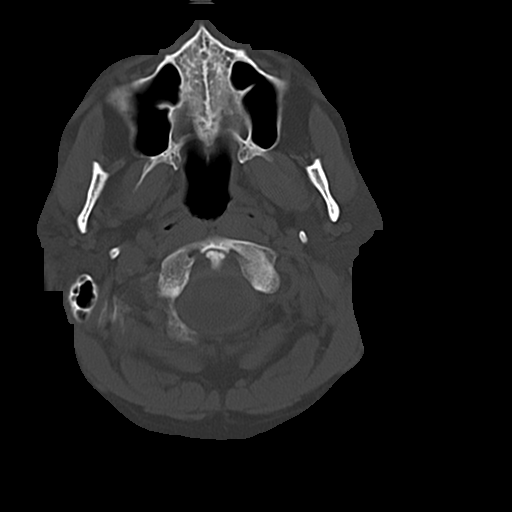
[im 7/33  bone]
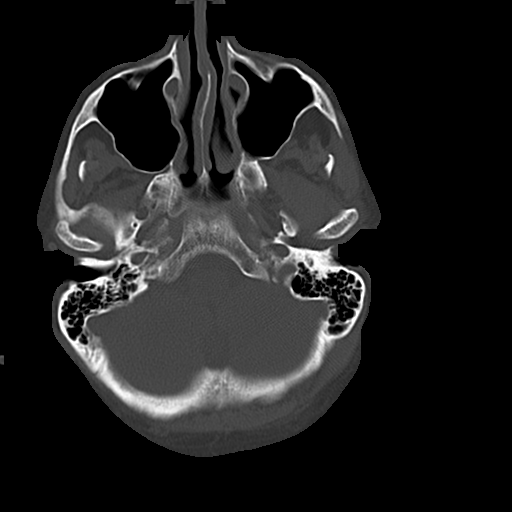
[im 12/33  bone]
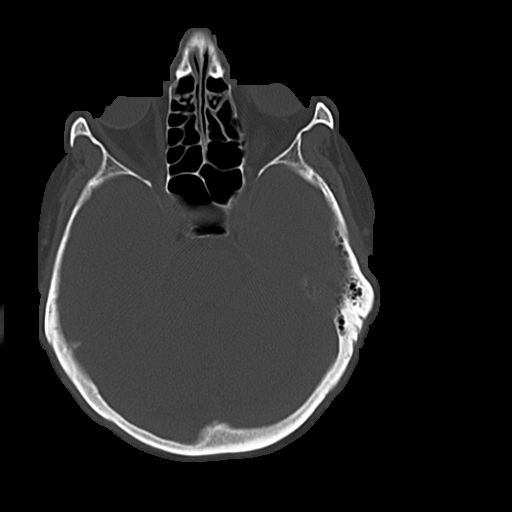

[16 of 30 positions shown; findings below may reference images not displayed]

FINDINGS: There is no evidence of acute intracranial hemorrhage, mass lesion,
brain edema or extra-axial fluid collection. The ventricles and
subarachnoid spaces are appropriately sized for age. There is no CT
evidence of acute cortical infarction.

The visualized paranasal sinuses, mastoid air cells and middle ears
are clear. The calvarium is intact.
IMPRESSION: Negative noncontrast head CT.

## 2017-12-29 ENCOUNTER — Encounter: Payer: Self-pay | Admitting: Physical Medicine & Rehabilitation

## 2017-12-29 ENCOUNTER — Ambulatory Visit (HOSPITAL_BASED_OUTPATIENT_CLINIC_OR_DEPARTMENT_OTHER): Payer: Medicare Other | Admitting: Physical Medicine & Rehabilitation

## 2017-12-29 ENCOUNTER — Encounter: Payer: Medicare Other | Attending: Physical Medicine & Rehabilitation

## 2017-12-29 VITALS — BP 146/85 | HR 89 | Ht 73.0 in | Wt 239.0 lb

## 2017-12-29 DIAGNOSIS — G8929 Other chronic pain: Secondary | ICD-10-CM | POA: Diagnosis not present

## 2017-12-29 DIAGNOSIS — Z5181 Encounter for therapeutic drug level monitoring: Secondary | ICD-10-CM | POA: Insufficient documentation

## 2017-12-29 DIAGNOSIS — G43909 Migraine, unspecified, not intractable, without status migrainosus: Secondary | ICD-10-CM | POA: Diagnosis not present

## 2017-12-29 DIAGNOSIS — Z79899 Other long term (current) drug therapy: Secondary | ICD-10-CM | POA: Diagnosis not present

## 2017-12-29 DIAGNOSIS — M47816 Spondylosis without myelopathy or radiculopathy, lumbar region: Secondary | ICD-10-CM | POA: Insufficient documentation

## 2017-12-29 DIAGNOSIS — M961 Postlaminectomy syndrome, not elsewhere classified: Secondary | ICD-10-CM | POA: Insufficient documentation

## 2017-12-29 DIAGNOSIS — E785 Hyperlipidemia, unspecified: Secondary | ICD-10-CM | POA: Insufficient documentation

## 2017-12-29 DIAGNOSIS — Z87891 Personal history of nicotine dependence: Secondary | ICD-10-CM | POA: Diagnosis not present

## 2017-12-29 DIAGNOSIS — M545 Low back pain: Secondary | ICD-10-CM | POA: Diagnosis not present

## 2017-12-29 MED ORDER — TRAMADOL HCL 50 MG PO TABS: 100.0000 mg | ORAL_TABLET | Freq: Four times a day (QID) | ORAL | 5 refills | Status: DC

## 2017-12-29 MED ORDER — TRAMADOL HCL 50 MG PO TABS: 100.0000 mg | ORAL_TABLET | Freq: Four times a day (QID) | ORAL | 5 refills | Status: AC

## 2017-12-29 NOTE — Progress Notes (Signed)
[facility]  I have changed the pharmacy and called the tramadol order placed by Dr Letta Pate to that pharmacy and called and cancelled the electronic order placed to [facility].

## 2017-12-29 NOTE — Progress Notes (Signed)
Subjective:    Patient ID: Zachary Eve[facility], male    DOB: 1950-12-04, 67 y.o.   MRN: [id number]  HPI  67 year old male with chronic low back pain related to lumbar spondylosis.  He has no radicular discomfort.  His pain has not been progressive.  He has no bowel or bladder dysfunction.  His last visit was approximately 6 months ago. New job at [employer] start 01-27-2023 Father passed away from Pancreatic cancer Interval medical history seen by urology with hypo-testosteronism, on AndroGel Pain Inventory Average Pain 5 Pain Right Now 3 My pain is burning, dull and aching  In the last 24 hours, has pain interfered with the following? General activity 3 Relation with others 2 Enjoyment of life 4 What TIME of day is your pain at its worst? morning Sleep (in general) Fair  Pain is worse with: standing and . Pain improves with: pacing activities Relief from Meds: 2  Mobility walk without assistance ability to climb steps?  yes do you drive?  yes  Function retired  Neuro/Psych spasms  Prior Studies Any changes since last visit?  no  Physicians involved in your care Any changes since last visit?  no   Family History  Problem Relation Age of Onset  . Kidney disease Mother    Social History   Socioeconomic History  . Marital status: Married    Spouse name: [name]  . Number of children: 4  . Years of education: Not on file  . Highest education level: Not on file  Occupational History  . 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 Financial resource strain: Not on file  . Food insecurity:    Worry: Not on file    Inability: Not on file  . Transportation needs:    Medical: Not on file    Non-medical: Not on file  Tobacco Use  . Smoking status: Former Smoker    Last attempt to quit: 02/23/1986    Years since quitting: 31.8  . Smokeless tobacco: Never Used  Substance and Sexual Activity  . Alcohol use: Yes   Alcohol/week: 1.8 oz    Types: 3 Glasses of wine per week  . Drug use: Not on file  . Sexual activity: Not on file  Lifestyle  . Physical activity:    Days per week: Not on file    Minutes per session: Not on file  . Stress: Not on file  Relationships  . Social connections:    Talks on phone: Not on file    Gets together: Not on file    Attends religious service: Not on file    Active member of club or organization: Not on file    Attends meetings of clubs or organizations: Not on file    Relationship status: Not on file  Other Topics Concern  . Not on file  Social History Narrative   Lives with his wife.  All children are adults, and live independently, but within a mile of the patient.   Past Surgical History:  Procedure Laterality Date  . CIRCUMCISION  02/25/2012   Procedure: CIRCUMCISION ADULT;  Surgeon: Claybon Jabs, MD;  Location: [facility];  Service: Urology;  Laterality: N/A;  1 hour requested for this procedure  . LAMINECTOMY  1987   L5 - S1  . LAPAROSCOPIC UNDERLAY VENTRAL HERNIA REPAIR W/ MESH  [phone]   PERIUMBILICAL VENTRAL HERNIA   Past Medical History:  Diagnosis Date  . Chronic back pain   . Hyperlipidemia   . Lumbar spondylosis   . Migraines   . Phimosis   . Postlaminectomy syndrome, lumbar region   . Testosterone deficiency   . Thalassemia minor    BP (!) 146/85   Pulse 89   Ht 6\' 1"  (1.854 m) Comment: stated  Wt 239 lb (108.4 kg) Comment: stated  SpO2 95%   BMI 31.53 kg/m   Opioid Risk Score:   Fall Risk Score:  `1  Depression screen PHQ 2/9  Depression screen PHQ 2/9 07/01/2016  Decreased Interest 0  Down, Depressed, Hopeless 0  PHQ - 2 Score 0     Review of Systems  Constitutional: Negative.   HENT: Negative.   Eyes: Negative.   Respiratory: Negative.   Cardiovascular: Negative.   Gastrointestinal: Negative.   Endocrine: Negative.   Genitourinary: Negative.   Musculoskeletal: Positive for arthralgias, back  pain and myalgias.  Skin: Negative.   Allergic/Immunologic: Negative.   Neurological: Negative.   Hematological: Negative.   Psychiatric/Behavioral: Negative.   All other systems reviewed and are negative.      Objective:   Physical Exam  Constitutional: He is oriented to person, place, and time. He appears well-developed and well-nourished.  HENT:  Head: Normocephalic and atraumatic.  Eyes: Pupils are equal, round, and reactive to light. Conjunctivae are normal.  Musculoskeletal:       Lumbar back: He exhibits pain. He exhibits normal range of motion and no tenderness.  No pain with lumbar extension bilaterally. Negative straight leg raising  Neurological: He is alert and oriented to person, place, and time. He exhibits normal muscle tone. Coordination and gait normal.  Her strength is 5/5 bilateral hip flexor knee extensor ankle dorsiflexor.  Deep tendon reflexes normal  Psychiatric: He has a normal mood and affect.  Nursing note and vitals reviewed.         Assessment & Plan:  1.  Lumbar spondylosis without myelopathy with chronic midline low back pain. He gets good relief with tramadol 100 mg 4 times daily.  Recommend continuing current dosage.  No signs of misuse.  Indication for chronic opioid: Lumbar spondylosis without myelopathy Medication and dose: Tramadol 100 mg 4 times daily # pills per month: 240 Last UDS date: 12/29/2017 Opioid Treatment Agreement signed (Y/N): 12/29/2017 Opioid Treatment Agreement last reviewed with patient:  12/29/2017 [facility] reviewed this encounter (include red flags):  12/29/2017 no red flags  Physical medicine rehabilitation follow-up in 6 months

## 2018-01-07 LAB — TOXASSURE SELECT,+ANTIDEPR,UR

## 2018-01-10 ENCOUNTER — Telehealth: Payer: Self-pay | Admitting: *Deleted

## 2018-01-10 NOTE — Telephone Encounter (Signed)
Urine drug screen ordered 12/29/17.  When reading results it was found that the urine was not collected until 01/02/18 (4 days later and after a weekend).  Urine is inconsistent.  Positive for unprescribed Ritalin metabolite methylphenidate.  PMP aware reviewed for 2 years hx.

## 2018-01-10 NOTE — Telephone Encounter (Signed)
Would d/c pt

## 2018-01-10 NOTE — Telephone Encounter (Signed)
I called [facility] and had them cancel the prescription that they have on file for the Tramadol 100 mg qid #240 with 5 refills.  He filled his last prescription off of the 07/18/18 rx which completed that prescription refills. He is due to fill again in 2 days. I gave HT permission to give him one fill of 30 days as his final Rx from our office.  I called and spoke with [name] and informed him of Dr Letta Pate decision to discharge based on not having his urine test on the day it was ordered and there was methyphenidate and ritalinic acid which is the metabolite of methylphenidate. He denied taking any other medication and said he did not understand that he was doing wrong about the urine test. He had never been asked for one before and was not prepared for one.  I explained we do not prepare patients a head of time for urine drug screens. I reviewed the issue with opioids in the media and how we are held accountable for our prescribing of these medications by the [facility] and that we have policies in place to follow. He said he understood.

## 2018-06-26 ENCOUNTER — Ambulatory Visit: Payer: Medicare Other | Admitting: Physical Medicine & Rehabilitation

## 2019-03-05 ENCOUNTER — Telehealth: Payer: Self-pay | Admitting: Internal Medicine

## 2019-03-05 NOTE — Telephone Encounter (Signed)
We received a referral and records for patient to have a colonoscopy. Patient last colon was in 2014. Per patient request patient would like to see Dr. Hilarie Fredrickson.  Records will be sent for review. Please advise for scheduling.

## 2019-03-13 NOTE — Telephone Encounter (Signed)
Per Dr. Hilarie Fredrickson okay to schedule direct colon.

## 2019-04-04 ENCOUNTER — Other Ambulatory Visit: Payer: Self-pay

## 2019-04-04 ENCOUNTER — Ambulatory Visit (AMBULATORY_SURGERY_CENTER): Payer: Self-pay

## 2019-04-04 VITALS — Ht 73.0 in | Wt 240.0 lb

## 2019-04-04 DIAGNOSIS — Z86010 Personal history of colonic polyps: Secondary | ICD-10-CM

## 2019-04-04 MED ORDER — PEG 3350-KCL-NA BICARB-NACL 420 G PO SOLR: 4000.0000 mL | Freq: Once | ORAL | 0 refills | Status: AC

## 2019-04-04 NOTE — Progress Notes (Signed)
Denies allergies to eggs or soy products. Denies complication of anesthesia or sedation. Denies use of weight loss medication. Denies use of O2.   Emmi instructions given for colonoscopy.  Pre-Visit was conducted by phone due to Covid 19. Instructions were reviewed and mailed to patients confirmed home address. Patient was encouraged to call if he had any questions regarding instructions.

## 2019-04-20 ENCOUNTER — Telehealth: Payer: Self-pay | Admitting: Internal Medicine

## 2019-04-20 NOTE — Telephone Encounter (Signed)

## 2019-04-23 ENCOUNTER — Ambulatory Visit (AMBULATORY_SURGERY_CENTER): Payer: Medicare Other | Admitting: Internal Medicine

## 2019-04-23 ENCOUNTER — Encounter: Payer: Self-pay | Admitting: Internal Medicine

## 2019-04-23 ENCOUNTER — Other Ambulatory Visit: Payer: Self-pay

## 2019-04-23 VITALS — BP 142/91 | HR 73 | Temp 97.6°F | Resp 17 | Ht 73.0 in | Wt 240.0 lb

## 2019-04-23 DIAGNOSIS — D123 Benign neoplasm of transverse colon: Secondary | ICD-10-CM

## 2019-04-23 DIAGNOSIS — K635 Polyp of colon: Secondary | ICD-10-CM | POA: Diagnosis not present

## 2019-04-23 DIAGNOSIS — Z86010 Personal history of colonic polyps: Secondary | ICD-10-CM

## 2019-04-23 MED ORDER — SODIUM CHLORIDE 0.9 % IV SOLN: 500.0000 mL | Freq: Once | INTRAVENOUS | Status: DC

## 2019-04-23 NOTE — Patient Instructions (Signed)
Handouts given for polyps and diverticulosis.  Await pathology results.  YOU HAD AN ENDOSCOPIC PROCEDURE TODAY AT [facility]:   Refer to the procedure report that was given to you for any specific questions about what was found during the examination.  If the procedure report does not answer your questions, please call your gastroenterologist to clarify.  If you requested that your care partner not be given the details of your procedure findings, then the procedure report has been included in a sealed envelope for you to review at your convenience later.  YOU SHOULD EXPECT: Some feelings of bloating in the abdomen. Passage of more gas than usual.  Walking can help get rid of the air that was put into your GI tract during the procedure and reduce the bloating. If you had a lower endoscopy (such as a colonoscopy or flexible sigmoidoscopy) you may notice spotting of blood in your stool or on the toilet paper. If you underwent a bowel prep for your procedure, you may not have a normal bowel movement for a few days.  Please Note:  You might notice some irritation and congestion in your nose or some drainage.  This is from the oxygen used during your procedure.  There is no need for concern and it should clear up in a day or so.  SYMPTOMS TO REPORT IMMEDIATELY:   Following lower endoscopy (colonoscopy or flexible sigmoidoscopy):  Excessive amounts of blood in the stool  Significant tenderness or worsening of abdominal pains  Swelling of the abdomen that is new, acute  Fever of 100F or higher  For urgent or emergent issues, a gastroenterologist can be reached at any hour by calling [phone].   DIET:  We do recommend a small meal at first, but then you may proceed to your regular diet.  Drink plenty of fluids but you should avoid alcoholic beverages for 24 hours.  ACTIVITY:  You should plan to take it easy for the rest of today and you should NOT DRIVE or use heavy machinery  until tomorrow (because of the sedation medicines used during the test).    FOLLOW UP: Our staff will call the number listed on your records 48-72 hours following your procedure to check on you and address any questions or concerns that you may have regarding the information given to you following your procedure. If we do not reach you, we will leave a message.  We will attempt to reach you two times.  During this call, we will ask if you have developed any symptoms of COVID 19. If you develop any symptoms (ie: fever, flu-like symptoms, shortness of breath, cough etc.) before then, please call [phone].  If you test positive for Covid 19 in the 2 weeks post procedure, please call and report this information to [place].    If any biopsies were taken you will be contacted by phone or by letter within the next 1-3 weeks.  Please call us at [phone] if you have not heard about the biopsies in 3 weeks.    SIGNATURES/CONFIDENTIALITY: You and/or your care partner have signed paperwork which will be entered into your electronic medical record.  These signatures attest to the fact that that the information above on your After Visit Summary has been reviewed and is understood.  Full responsibility of the confidentiality of this discharge information lies with you and/or your care-partner.

## 2019-04-23 NOTE — Op Note (Signed)
[facility] Patient Name: Zachary Everett Procedure Date: 04/23/2019 8:14 AM MRN: [id number] Endoscopist: Jerene Bears , MD Age: 68 Referring MD:  Date of Birth: 1950-11-13 Gender: Male Account #: [id number] Procedure:                Colonoscopy Indications:              High risk colon cancer surveillance: Personal                            history of non-advanced adenoma, Last colonoscopy:                            January 2014 Medicines:                Monitored Anesthesia Care Procedure:                Pre-Anesthesia Assessment:                           - Prior to the procedure, a History and Physical                            was performed, and patient medications and                            allergies were reviewed. The patient's tolerance of                            previous anesthesia was also reviewed. The risks                            and benefits of the procedure and the sedation                            options and risks were discussed with the patient.                            All questions were answered, and informed consent                            was obtained. Prior Anticoagulants: The patient has                            taken no previous anticoagulant or antiplatelet                            agents. ASA Grade Assessment: II - A patient with                            mild systemic disease. After reviewing the risks                            and benefits, the patient was deemed in  satisfactory condition to undergo the procedure.                           After obtaining informed consent, the colonoscope                            was passed under direct vision. Throughout the                            procedure, the patient's blood pressure, pulse, and                            oxygen saturations were monitored continuously. The                            Colonoscope was introduced through the anus and                           advanced to the cecum, identified by appendiceal                            orifice and ileocecal valve. The colonoscopy was                            performed without difficulty. The patient tolerated                            the procedure well. The quality of the bowel                            preparation was good. The ileocecal valve,                            appendiceal orifice, and rectum were photographed. Scope In: 8:21:12 AM Scope Out: 8:35:26 AM Scope Withdrawal Time: 0 hours 10 minutes 56 seconds  Total Procedure Duration: 0 hours 14 minutes 14 seconds  Findings:                 The digital rectal exam was normal.                           A 4 mm polyp was found in the transverse colon. The                            polyp was sessile. The polyp was removed with a                            cold snare. Resection and retrieval were complete.                           Multiple small-mouthed diverticula were found in                            the sigmoid colon, descending colon and transverse  colon.                           The retroflexed view of the distal rectum and anal                            verge was normal and showed no anal or rectal                            abnormalities. Complications:            No immediate complications. Estimated Blood Loss:     Estimated blood loss was minimal. Impression:               - One 4 mm polyp in the transverse colon, removed                            with a cold snare. Resected and retrieved.                           - Diverticulosis in the sigmoid colon, in the                            descending colon and in the transverse colon.                           - The distal rectum and anal verge are normal on                            retroflexion view. Recommendation:           - Patient has a contact number available for                            emergencies. The signs  and symptoms of potential                            delayed complications were discussed with the                            patient. Return to normal activities tomorrow.                            Written discharge instructions were provided to the                            patient.                           - Resume previous diet.                           - Continue present medications.                           - Await pathology results.                           -  Repeat colonoscopy is recommended for                            surveillance. The colonoscopy date will be                            determined after pathology results from today's                            exam become available for review. Jerene Bears, MD 04/23/2019 8:39:06 AM This report has been signed electronically.

## 2019-04-23 NOTE — Progress Notes (Signed)
Pt's states no medical or surgical changes since previsit or office visit. 

## 2019-04-23 NOTE — Progress Notes (Signed)
Report to PACU, RN, vss, BBS= Clear.  

## 2019-04-23 NOTE — Progress Notes (Signed)
Fayrene Fearing took temp and Rica Mote took vitals.

## 2019-04-23 NOTE — Progress Notes (Signed)
Called to room to assist during endoscopic procedure.  Patient ID and intended procedure confirmed with present staff. Received instructions for my participation in the procedure from the performing physician.  

## 2019-04-25 ENCOUNTER — Telehealth: Payer: Self-pay

## 2019-04-25 NOTE — Telephone Encounter (Signed)
  Follow up Call-  Call back number 04/23/2019  Post procedure Call Back phone  # [phone]  Permission to leave phone message Yes  Some recent data might be hidden     Patient questions:  Do you have a fever, pain , or abdominal swelling? No. Pain Score  0 *  Have you tolerated food without any problems? Yes.    Have you been able to return to your normal activities? Yes.    Do you have any questions about your discharge instructions: Diet   No. Medications  No. Follow up visit  No.  Do you have questions or concerns about your Care? No.  Actions: * If pain score is 4 or above: No action needed, pain <4. 1. Have you developed a fever since your procedure? no  2.   Have you had an respiratory symptoms (SOB or cough) since your procedure? no  3.   Have you tested positive for COVID 19 since your procedure no  4.   Have you had any family members/close contacts diagnosed with the COVID 19 since your procedure?  no   If yes to any of these questions please route to Joylene John, RN and Alphonsa Gin, Therapist, sports.

## 2019-04-26 ENCOUNTER — Encounter: Payer: Self-pay | Admitting: Internal Medicine

## 2019-06-04 ENCOUNTER — Ambulatory Visit (INDEPENDENT_AMBULATORY_CARE_PROVIDER_SITE_OTHER): Payer: Medicare Other | Admitting: Adult Health

## 2019-06-04 ENCOUNTER — Other Ambulatory Visit: Payer: Self-pay

## 2019-06-04 ENCOUNTER — Encounter: Payer: Self-pay | Admitting: Adult Health

## 2019-06-04 DIAGNOSIS — F22 Delusional disorders: Secondary | ICD-10-CM

## 2019-06-04 DIAGNOSIS — F411 Generalized anxiety disorder: Secondary | ICD-10-CM | POA: Diagnosis not present

## 2019-06-04 DIAGNOSIS — G4700 Insomnia, unspecified: Secondary | ICD-10-CM

## 2019-06-04 DIAGNOSIS — F331 Major depressive disorder, recurrent, moderate: Secondary | ICD-10-CM

## 2019-06-04 MED ORDER — ALPRAZOLAM 0.5 MG PO TABS: 0.5000 mg | ORAL_TABLET | Freq: Every evening | ORAL | 5 refills | Status: DC | PRN

## 2019-06-04 MED ORDER — RISPERIDONE 3 MG PO TABS: 3.0000 mg | ORAL_TABLET | Freq: Every day | ORAL | 1 refills | Status: DC

## 2019-06-04 MED ORDER — ESCITALOPRAM OXALATE 10 MG PO TABS: 10.0000 mg | ORAL_TABLET | Freq: Every day | ORAL | 1 refills | Status: DC

## 2019-06-04 NOTE — Progress Notes (Signed)
DAMEK BRAZILE [id number] 06-22-1951 68 y.o.  Subjective:   Patient ID:  Zachary Everett is a 68 y.o. (DOB [birth year]) male.  Chief Complaint:  Chief Complaint  Patient presents with  . Anxiety  . Depression  . Other    Delusional Disorder  . Sleeping Problem    HPI LACHARLES DIMITRIOU presents to the office today for follow-up of anxiety, depression, insomnia, and delusional disorder.  Describes mood today as "ok". Pleasant. Mood symptoms - denies depression, anxiety, and irritability. Stating "everything is going great". He and wife doing well. Stable interest and motivation. Taking medications as prescribed and feels they continue to work well for him.   Energy levels stable. Active, does not have a regular exercise regimen. Works full time for [employer] - stating "I really enjoy my job".  Enjoys usual interests and activities. Spending time with family. Mostly staying at home or with "family unit". Has a vacation planned with family next week to [facility]. Appetite adequate. Weight stable. Sleeps well most nights. Averages 6 to 8 hours. Uses Xanax some nights to help with initiation of sleep. Focus and concentration stable. Completing tasks. Managing aspects of household. Work going well. Denies SI or HI. Denies AH or VH.   Review of Systems:  Review of Systems  Musculoskeletal: Negative for gait problem.  Neurological: Negative for tremors.  Psychiatric/Behavioral:       Please refer to HPI    Medications: I have reviewed the patient's current medications.  Current Outpatient Medications  Medication Sig Dispense Refill  . ALPRAZolam (XANAX) 0.5 MG tablet Take 1 tablet (0.5 mg total) by mouth at bedtime as needed for anxiety. 30 tablet 5  . aspirin-acetaminophen-caffeine (EXCEDRIN MIGRAINE) 250-250-65 MG tablet Take 2 tablets by mouth every 6 (six) hours as needed for headache.    Marland Kitchen atorvastatin (LIPITOR) 20 MG tablet TAKE 1 TABLET (20 MG TOTAL)  BY MOUTH DAILY.    Marland Kitchen CALCIUM PO Take 2 tablets by mouth daily.    . diclofenac sodium (VOLTAREN) 1 % GEL Apply 2 g topically 4 (four) times daily. 3 Tube 3  . escitalopram (LEXAPRO) 10 MG tablet Take 1 tablet (10 mg total) by mouth daily. 90 tablet 1  . GAVILYTE-N WITH FLAVOR PACK 420 g solution     . MAGNESIUM PO Take 1 tablet by mouth daily.    . risperiDONE (RISPERDAL) 2 MG tablet Take 2 mg by mouth at bedtime.    . risperiDONE (RISPERDAL) 3 MG tablet Take 1 tablet (3 mg total) by mouth daily. 90 tablet 1  . Testosterone (ANDROGEL PUMP) 20.25 MG/ACT (1.62%) GEL APPLY 4 PUMPS ONTO THE SKIN DAILY AS DIRECTED    . Testosterone 12.5 MG/ACT (1%) GEL     . traMADol (ULTRAM) 50 MG tablet Take 2 tablets (100 mg total) by mouth 4 (four) times daily. 240 tablet 5   No current facility-administered medications for this visit.     Medication Side Effects: None  Allergies:  Allergies  Allergen Reactions  . Other     MSG-migraine, swelling  . Penicillins Rash    Has patient had a PCN reaction causing immediate rash, facial/tongue/throat swelling, SOB or lightheadedness with hypotension: Yes Has patient had a PCN reaction causing severe rash involving mucus membranes or skin necrosis: Yes -all over body. Has patient had a PCN reaction that required hospitalization Pt was already in the hospital  Has patient had a PCN reaction occurring within the last 10 years: No If  all of the above answers are "NO", then may proceed with Cephalosporin use.     Past Medical History:  Diagnosis Date  . Anxiety   . Cataract   . Chronic back pain   . Depression   . Hyperlipidemia   . Lumbar spondylosis   . Migraines   . Phimosis   . Postlaminectomy syndrome, lumbar region   . Testosterone deficiency   . Thalassemia minor     Family History  Problem Relation Age of Onset  . Kidney disease Mother   . Pancreatic cancer Father   . Colon cancer Neg Hx   . Esophageal cancer Neg Hx   . Rectal cancer  Neg Hx   . Stomach cancer Neg Hx     Social History   Socioeconomic History  . Marital status: Married    Spouse name: [name]  . Number of children: 4  . Years of education: Not on file  . Highest education level: Not on file  Occupational History  . 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 Financial resource strain: Not on file  . Food insecurity    Worry: Not on file    Inability: Not on file  . Transportation needs    Medical: Not on file    Non-medical: Not on file  Tobacco Use  . Smoking status: Former Smoker    Quit date: 02/23/1986    Years since quitting: 33.2  . Smokeless tobacco: Never Used  Substance and Sexual Activity  . Alcohol use: Yes    Alcohol/week: 3.0 standard drinks    Types: 3 Glasses of wine per week  . Drug use: Never  . Sexual activity: Not on file  Lifestyle  . Physical activity    Days per week: Not on file    Minutes per session: Not on file  . Stress: Not on file  Relationships  . Social Herbalist on phone: Not on file    Gets together: Not on file    Attends religious service: Not on file    Active member of club or organization: Not on file    Attends meetings of clubs or organizations: Not on file    Relationship status: Not on file  . Intimate partner violence    Fear of current or ex partner: Not on file    Emotionally abused: Not on file    Physically abused: Not on file    Forced sexual activity: Not on file  Other Topics Concern  . Not on file  Social History Narrative   Lives with his wife.  All children are adults, and live independently, but within a mile of the patient.    Past Medical History, Surgical history, Social history, and Family history were reviewed and updated as appropriate.   Please see review of systems for further details on the patient's review from today.   Objective:   Physical Exam:  There were no vitals taken for this  visit.  Physical Exam  Lab Review:     Component Value Date/Time   NA 141 11/01/2016 1346   K 3.9 11/01/2016 1346   CL 104 11/01/2016 1346   CO2 26 11/01/2016 1346   GLUCOSE 113 (H) 11/01/2016 1346   BUN 17 11/01/2016 1346   CREATININE 0.77 11/01/2016 1346   CALCIUM 9.4 11/01/2016 1346   PROT 8.2 (H) 11/01/2016 1346   ALBUMIN 4.5 11/01/2016 1346  AST 22 11/01/2016 1346   ALT 21 11/01/2016 1346   ALKPHOS 94 11/01/2016 1346   BILITOT 1.0 11/01/2016 1346   GFRNONAA >60 11/01/2016 1346   GFRAA >60 11/01/2016 1346       Component Value Date/Time   WBC 11.2 (H) 11/01/2016 1346   RBC 6.62 (H) 11/01/2016 1346   HGB 12.7 (L) 11/01/2016 1346   HCT 39.7 11/01/2016 1346   PLT 204 11/01/2016 1346   MCV 60.0 (L) 11/01/2016 1346   MCV 63.3 (A) 10/04/2013 1627   MCH 19.2 (L) 11/01/2016 1346   MCHC 32.0 11/01/2016 1346   RDW 16.9 (H) 11/01/2016 1346   LYMPHSABS 2.5 09/07/2015 1259   MONOABS 0.6 09/07/2015 1259   EOSABS 0.2 09/07/2015 1259   BASOSABS 0.1 09/07/2015 1259    No results found for: POCLITH, LITHIUM   No results found for: PHENYTOIN, PHENOBARB, VALPROATE, CBMZ   .res Assessment: Plan:    Plan:  1. Lexapro 10mg  daily 2. Risperdal 3mg  at hs  3. Xanax 0.5mg  at hs  RTC 6 months  Patient advised to contact office with any questions, adverse effects, or acute worsening in signs and symptoms.  Discussed potential benefits, risk, and side effects of benzodiazepines to include potential risk of tolerance and dependence, as well as possible drowsiness.  Advised patient not to drive if experiencing drowsiness and to take lowest possible effective dose to minimize risk of dependence and tolerance.  Discussed potential metabolic side effects associated with atypical antipsychotics, as well as potential risk for movement side effects. Advised pt to contact office if movement side effects occur.   Zachary Everett was seen today for anxiety, depression, other and sleeping  problem.  Diagnoses and all orders for this visit:  Generalized anxiety disorder -     ALPRAZolam (XANAX) 0.5 MG tablet; Take 1 tablet (0.5 mg total) by mouth at bedtime as needed for anxiety. -     escitalopram (LEXAPRO) 10 MG tablet; Take 1 tablet (10 mg total) by mouth daily. -     risperiDONE (RISPERDAL) 3 MG tablet; Take 1 tablet (3 mg total) by mouth daily.  Major depressive disorder, recurrent episode, moderate (HCC) -     escitalopram (LEXAPRO) 10 MG tablet; Take 1 tablet (10 mg total) by mouth daily. -     risperiDONE (RISPERDAL) 3 MG tablet; Take 1 tablet (3 mg total) by mouth daily.  Insomnia, unspecified type -     risperiDONE (RISPERDAL) 3 MG tablet; Take 1 tablet (3 mg total) by mouth daily.  Delusional disorder ([place]) -     risperiDONE (RISPERDAL) 3 MG tablet; Take 1 tablet (3 mg total) by mouth daily.     Please see After Visit Summary for patient specific instructions.  No future appointments.  No orders of the defined types were placed in this encounter.   -------------------------------

## 2019-11-24 ENCOUNTER — Other Ambulatory Visit: Payer: Self-pay | Admitting: Adult Health

## 2019-11-24 DIAGNOSIS — F411 Generalized anxiety disorder: Secondary | ICD-10-CM

## 2019-11-29 ENCOUNTER — Ambulatory Visit: Payer: Medicare Other | Attending: Internal Medicine

## 2019-11-29 DIAGNOSIS — Z23 Encounter for immunization: Secondary | ICD-10-CM

## 2019-11-29 NOTE — Progress Notes (Signed)
   Covid-19 Vaccination Clinic  Name:  Zachary Everett    MRN: [id number] DOB: 1951/08/02  11/29/2019  Mr. Rocks was observed post Covid-19 immunization for 15 minutes without incidence. He was provided with Vaccine Information Sheet and instruction to access the V-Safe system.   [name] was instructed to call 911 with any severe reactions post vaccine: Marland Kitchen Difficulty breathing  . Swelling of your face and throat  . A fast heartbeat  . A bad rash all over your body  . Dizziness and weakness    Immunizations Administered    Name Date Dose VIS Date Route   Pfizer COVID-19 Vaccine 11/29/2019  2:44 PM 0.3 mL 09/14/2019 Intramuscular   Manufacturer: [place]   Lot: [id number]   [place]: [id number]

## 2019-12-22 ENCOUNTER — Other Ambulatory Visit: Payer: Self-pay | Admitting: Adult Health

## 2019-12-22 DIAGNOSIS — F22 Delusional disorders: Secondary | ICD-10-CM

## 2019-12-22 DIAGNOSIS — G4700 Insomnia, unspecified: Secondary | ICD-10-CM

## 2019-12-22 DIAGNOSIS — F411 Generalized anxiety disorder: Secondary | ICD-10-CM

## 2019-12-22 DIAGNOSIS — F331 Major depressive disorder, recurrent, moderate: Secondary | ICD-10-CM

## 2019-12-26 ENCOUNTER — Ambulatory Visit: Payer: Medicare Other | Attending: Internal Medicine

## 2019-12-26 DIAGNOSIS — Z23 Encounter for immunization: Secondary | ICD-10-CM

## 2019-12-26 NOTE — Progress Notes (Signed)
   Covid-19 Vaccination Clinic  Name:  FLORIS CIENFUEGOS    MRN: [id number] DOB: Mar 22, 1951  12/26/2019  Mr. Gipe was observed post Covid-19 immunization for 15 minutes without incident. He was provided with Vaccine Information Sheet and instruction to access the V-Safe system.   Mr. Ruether was instructed to call 911 with any severe reactions post vaccine: Marland Kitchen Difficulty breathing  . Swelling of face and throat  . A fast heartbeat  . A bad rash all over body  . Dizziness and weakness   Immunizations Administered    Name Date Dose VIS Date Route   Pfizer COVID-19 Vaccine 12/26/2019  2:40 PM 0.3 mL 09/14/2019 Intramuscular   Manufacturer: [place]   Lot: [id number]   [place]: [id number]

## 2020-01-16 ENCOUNTER — Other Ambulatory Visit: Payer: Self-pay | Admitting: Adult Health

## 2020-01-16 DIAGNOSIS — F411 Generalized anxiety disorder: Secondary | ICD-10-CM

## 2020-01-16 DIAGNOSIS — F22 Delusional disorders: Secondary | ICD-10-CM

## 2020-01-16 DIAGNOSIS — G4700 Insomnia, unspecified: Secondary | ICD-10-CM

## 2020-01-16 DIAGNOSIS — F331 Major depressive disorder, recurrent, moderate: Secondary | ICD-10-CM

## 2020-02-21 ENCOUNTER — Other Ambulatory Visit: Payer: Self-pay | Admitting: Adult Health

## 2020-02-21 DIAGNOSIS — F331 Major depressive disorder, recurrent, moderate: Secondary | ICD-10-CM

## 2020-02-21 DIAGNOSIS — F411 Generalized anxiety disorder: Secondary | ICD-10-CM

## 2020-05-18 ENCOUNTER — Other Ambulatory Visit: Payer: Self-pay | Admitting: Adult Health

## 2020-05-18 DIAGNOSIS — F411 Generalized anxiety disorder: Secondary | ICD-10-CM

## 2020-05-18 DIAGNOSIS — F331 Major depressive disorder, recurrent, moderate: Secondary | ICD-10-CM

## 2020-07-12 ENCOUNTER — Other Ambulatory Visit: Payer: Self-pay | Admitting: Adult Health

## 2020-07-12 DIAGNOSIS — F331 Major depressive disorder, recurrent, moderate: Secondary | ICD-10-CM

## 2020-07-12 DIAGNOSIS — G4700 Insomnia, unspecified: Secondary | ICD-10-CM

## 2020-07-12 DIAGNOSIS — F411 Generalized anxiety disorder: Secondary | ICD-10-CM

## 2020-07-12 DIAGNOSIS — F22 Delusional disorders: Secondary | ICD-10-CM

## 2020-09-05 ENCOUNTER — Other Ambulatory Visit: Payer: Self-pay | Admitting: Adult Health

## 2020-09-05 ENCOUNTER — Telehealth: Payer: Self-pay | Admitting: Adult Health

## 2020-09-05 DIAGNOSIS — F411 Generalized anxiety disorder: Secondary | ICD-10-CM

## 2020-09-05 DIAGNOSIS — F331 Major depressive disorder, recurrent, moderate: Secondary | ICD-10-CM

## 2020-09-05 MED ORDER — ALPRAZOLAM 0.5 MG PO TABS: ORAL_TABLET | ORAL | 2 refills | Status: DC

## 2020-09-05 NOTE — Telephone Encounter (Signed)
Script sent  

## 2020-09-05 NOTE — Telephone Encounter (Signed)
Nicole Kindred called to check on status of Lexapro and Xanax.  I told him the Lexapro was sent in today but there was no request for the Xanax.  He needs to Xanax filled as well.  Made appt for 12/13.  Please send in Xanax refill to [facility] on [address].

## 2020-09-15 ENCOUNTER — Ambulatory Visit (INDEPENDENT_AMBULATORY_CARE_PROVIDER_SITE_OTHER): Payer: Medicare Other | Admitting: Adult Health

## 2020-09-15 ENCOUNTER — Encounter: Payer: Self-pay | Admitting: Adult Health

## 2020-09-15 ENCOUNTER — Other Ambulatory Visit: Payer: Self-pay

## 2020-09-15 DIAGNOSIS — F331 Major depressive disorder, recurrent, moderate: Secondary | ICD-10-CM | POA: Diagnosis not present

## 2020-09-15 DIAGNOSIS — F22 Delusional disorders: Secondary | ICD-10-CM

## 2020-09-15 DIAGNOSIS — F411 Generalized anxiety disorder: Secondary | ICD-10-CM

## 2020-09-15 DIAGNOSIS — G4700 Insomnia, unspecified: Secondary | ICD-10-CM

## 2020-09-15 MED ORDER — ALPRAZOLAM 0.5 MG PO TABS: ORAL_TABLET | ORAL | 2 refills | Status: DC

## 2020-09-15 MED ORDER — RISPERIDONE 3 MG PO TABS: 3.0000 mg | ORAL_TABLET | Freq: Every day | ORAL | 5 refills | Status: DC

## 2020-09-15 MED ORDER — ESCITALOPRAM OXALATE 20 MG PO TABS: 20.0000 mg | ORAL_TABLET | Freq: Every day | ORAL | 5 refills | Status: DC

## 2020-09-15 NOTE — Progress Notes (Signed)
Zachary Everett [id number] [birth year] 69 y.o.  Subjective:   Patient ID:  Zachary Everett is a 69 y.o. (DOB 04/18/1951) male.  Chief Complaint: No chief complaint on file.   HPI Zachary Everett presents to the office today for follow-up of anxiety, depression, insomnia, and delusional disorder.  Describes mood today as "ok". Pleasant. Mood symptoms - denies depression and irritability. Feels anxious at times. Stating "I'm doing fine". Having negative thoughts more than he would like to - ruminating over the past - worrying about granddaughters when they drive. Able to let go of some thoughts, not all. Having anxiety attacks from time to time. He and wife doing well. Celebrating the holidays with family. Stable interest and motivation. Taking medications as prescribed and feels they continue to work well for him.   Energy levels stable. Active, does not have a regular exercise regimen. Works full time for [employer]. Enjoys usual interests and activities. He and wife doing well. Spending time with family over the holidays. Mostly staying at home.  Appetite adequate. Weight stable. Sleeps well most nights. Averages 6 to 7 hours. Waking up during the night. Focus and concentration stable. Completing tasks. Managing aspects of household. Work going well. Denies SI or HI.  Denies AH or VH.     [facility]  PHQ-2 Total Score 0       Review of Systems:  Review of Systems  Musculoskeletal: Negative for gait problem.  Neurological: Negative for tremors.  Psychiatric/Behavioral:       Please refer to HPI    Medications: I have reviewed the patient's current medications.  Current Outpatient Medications  Medication Sig Dispense Refill  . ALPRAZolam (XANAX) 0.5 MG tablet TAKE ONE TABLET BY MOUTH EVERY NIGHT AT BEDTIME AS NEEDED FOR ANXIETY 30 tablet 2  . aspirin-acetaminophen-caffeine  (EXCEDRIN MIGRAINE) 250-250-65 MG tablet Take 2 tablets by mouth every 6 (six) hours as needed for headache.    Marland Kitchen atorvastatin (LIPITOR) 20 MG tablet TAKE 1 TABLET (20 MG TOTAL) BY MOUTH DAILY.    Marland Kitchen CALCIUM PO Take 2 tablets by mouth daily.    . diclofenac sodium (VOLTAREN) 1 % GEL Apply 2 g topically 4 (four) times daily. 3 Tube 3  . escitalopram (LEXAPRO) 20 MG tablet Take 1 tablet (20 mg total) by mouth daily. 30 tablet 5  . GAVILYTE-N WITH FLAVOR PACK 420 g solution     . MAGNESIUM PO Take 1 tablet by mouth daily.    . risperiDONE (RISPERDAL) 2 MG tablet Take 2 mg by mouth at bedtime.    . risperiDONE (RISPERDAL) 3 MG tablet Take 1 tablet (3 mg total) by mouth at bedtime. 30 tablet 5  . Testosterone (ANDROGEL PUMP) 20.25 MG/ACT (1.62%) GEL APPLY 4 PUMPS ONTO THE SKIN DAILY AS DIRECTED    . Testosterone 12.5 MG/ACT (1%) GEL     . traMADol (ULTRAM) 50 MG tablet Take 2 tablets (100 mg total) by mouth 4 (four) times daily. 240 tablet 5   No current facility-administered medications for this visit.    Medication Side Effects: None  Allergies:  Allergies  Allergen Reactions  . Monosodium Glutamate Other (See Comments)  . Other     MSG-migraine, swelling  . Penicillins Rash    Has patient had a PCN reaction causing immediate rash, facial/tongue/throat swelling, SOB or lightheadedness with hypotension: Yes Has patient had a PCN reaction causing severe rash involving mucus  membranes or skin necrosis: Yes -all over body. Has patient had a PCN reaction that required hospitalization Pt was already in the hospital  Has patient had a PCN reaction occurring within the last 10 years: No If all of the above answers are "NO", then may proceed with Cephalosporin use.     Past Medical History:  Diagnosis Date  . Anxiety   . Cataract   . Chronic back pain   . Depression   . Hyperlipidemia   . Lumbar spondylosis   . Migraines   . Phimosis   . Postlaminectomy syndrome, lumbar region   .  Testosterone deficiency   . Thalassemia minor     Family History  Problem Relation Age of Onset  . Kidney disease Mother   . Pancreatic cancer Father   . Colon cancer Neg Hx   . Esophageal cancer Neg Hx   . Rectal cancer Neg Hx   . Stomach cancer Neg Hx     Social History   Socioeconomic History  . Marital status: Married    Spouse name: [name]  . Number of children: 4  . Years of education: Not on file  . Highest education level: Not on file  Occupational History  . 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 Smoking status: Former Smoker    Quit date: 02/23/1986    Years since quitting: 34.5  . Smokeless tobacco: Never Used  Substance and Sexual Activity  . Alcohol use: Yes    Alcohol/week: 3.0 standard drinks    Types: 3 Glasses of wine per week  . Drug use: Never  . Sexual activity: Not on file  Other Topics Concern  . Not on file  Social History Narrative   Lives with his wife.  All children are adults, and live independently, but within a mile of the patient.   Social Determinants of Health   Financial Resource Strain: Not on file  Food Insecurity: Not on file  Transportation Needs: Not on file  Physical Activity: Not on file  Stress: Not on file  Social Connections: Not on file  Intimate Partner Violence: Not on file    Past Medical History, Surgical history, Social history, and Family history were reviewed and updated as appropriate.   Please see review of systems for further details on the patient's review from today.   Objective:   Physical Exam:  There were no vitals taken for this visit.  Physical Exam Constitutional:      General: He is not in acute distress. Musculoskeletal:        General: No deformity.  Neurological:     Mental Status: He is alert and oriented to person, place, and time.     Coordination: Coordination normal.  Psychiatric:        Attention and Perception: Attention  and perception normal. He does not perceive auditory or visual hallucinations.        Mood and Affect: Mood normal. Mood is not anxious or depressed. Affect is not labile, blunt, angry or inappropriate.        Speech: Speech normal.        Behavior: Behavior normal.        Thought Content: Thought content normal. Thought content is not paranoid or delusional. Thought content does not include homicidal or suicidal ideation. Thought content does not include homicidal or suicidal plan.        Cognition and Memory: Cognition and  memory normal.        Judgment: Judgment normal.     Comments: Insight intact     Lab Review:     Component Value Date/Time   NA 141 11/01/2016 1346   K 3.9 11/01/2016 1346   CL 104 11/01/2016 1346   CO2 26 11/01/2016 1346   GLUCOSE 113 (H) 11/01/2016 1346   BUN 17 11/01/2016 1346   CREATININE 0.77 11/01/2016 1346   CALCIUM 9.4 11/01/2016 1346   PROT 8.2 (H) 11/01/2016 1346   ALBUMIN 4.5 11/01/2016 1346   AST 22 11/01/2016 1346   ALT 21 11/01/2016 1346   ALKPHOS 94 11/01/2016 1346   BILITOT 1.0 11/01/2016 1346   GFRNONAA >60 11/01/2016 1346   GFRAA >60 11/01/2016 1346       Component Value Date/Time   WBC 11.2 (H) 11/01/2016 1346   RBC 6.62 (H) 11/01/2016 1346   HGB 12.7 (L) 11/01/2016 1346   HCT 39.7 11/01/2016 1346   PLT 204 11/01/2016 1346   MCV 60.0 (L) 11/01/2016 1346   MCV 63.3 (A) 10/04/2013 1627   MCH 19.2 (L) 11/01/2016 1346   MCHC 32.0 11/01/2016 1346   RDW 16.9 (H) 11/01/2016 1346   LYMPHSABS 2.5 09/07/2015 1259   MONOABS 0.6 09/07/2015 1259   EOSABS 0.2 09/07/2015 1259   BASOSABS 0.1 09/07/2015 1259    No results found for: POCLITH, LITHIUM   No results found for: PHENYTOIN, PHENOBARB, VALPROATE, CBMZ   .res Assessment: Plan:    Plan:  1. Lexapro 10mg  daily 2. Risperdal 3mg  at hs  3. Xanax 0.5mg  at hs  RTC 6 months  Patient advised to contact office with any questions, adverse effects, or acute worsening in signs and  symptoms.  Discussed potential benefits, risk, and side effects of benzodiazepines to include potential risk of tolerance and dependence, as well as possible drowsiness.  Advised patient not to drive if experiencing drowsiness and to take lowest possible effective dose to minimize risk of dependence and tolerance.  Discussed potential metabolic side effects associated with atypical antipsychotics, as well as potential risk for movement side effects. Advised pt to contact office if movement side effects occur.    Diagnoses and all orders for this visit:  Generalized anxiety disorder -     ALPRAZolam (XANAX) 0.5 MG tablet; TAKE ONE TABLET BY MOUTH EVERY NIGHT AT BEDTIME AS NEEDED FOR ANXIETY -     risperiDONE (RISPERDAL) 3 MG tablet; Take 1 tablet (3 mg total) by mouth at bedtime. -     escitalopram (LEXAPRO) 20 MG tablet; Take 1 tablet (20 mg total) by mouth daily.  Major depressive disorder, recurrent episode, moderate (HCC) -     risperiDONE (RISPERDAL) 3 MG tablet; Take 1 tablet (3 mg total) by mouth at bedtime. -     escitalopram (LEXAPRO) 20 MG tablet; Take 1 tablet (20 mg total) by mouth daily.  Insomnia, unspecified type -     risperiDONE (RISPERDAL) 3 MG tablet; Take 1 tablet (3 mg total) by mouth at bedtime.  Delusional disorder ([place]) -     risperiDONE (RISPERDAL) 3 MG tablet; Take 1 tablet (3 mg total) by mouth at bedtime.     Please see After Visit Summary for patient specific instructions.  No future appointments.  No orders of the defined types were placed in this encounter.   -------------------------------

## 2020-12-01 ENCOUNTER — Other Ambulatory Visit: Payer: Self-pay | Admitting: Adult Health

## 2021-03-16 ENCOUNTER — Encounter: Payer: Self-pay | Admitting: Adult Health

## 2021-03-16 ENCOUNTER — Telehealth (INDEPENDENT_AMBULATORY_CARE_PROVIDER_SITE_OTHER): Payer: Medicare Other | Admitting: Adult Health

## 2021-03-16 DIAGNOSIS — G4700 Insomnia, unspecified: Secondary | ICD-10-CM

## 2021-03-16 DIAGNOSIS — F22 Delusional disorders: Secondary | ICD-10-CM

## 2021-03-16 DIAGNOSIS — F411 Generalized anxiety disorder: Secondary | ICD-10-CM | POA: Diagnosis not present

## 2021-03-16 DIAGNOSIS — F331 Major depressive disorder, recurrent, moderate: Secondary | ICD-10-CM | POA: Diagnosis not present

## 2021-03-16 MED ORDER — RISPERIDONE 3 MG PO TABS: 3.0000 mg | ORAL_TABLET | Freq: Every day | ORAL | 5 refills | Status: DC

## 2021-03-16 MED ORDER — ALPRAZOLAM 0.5 MG PO TABS: ORAL_TABLET | ORAL | 2 refills | Status: DC

## 2021-03-16 MED ORDER — ESCITALOPRAM OXALATE 20 MG PO TABS: 20.0000 mg | ORAL_TABLET | Freq: Every day | ORAL | 5 refills | Status: DC

## 2021-03-16 NOTE — Progress Notes (Signed)
Zachary Everett [id number] Jul 07, 1951 70 y.o.  Virtual Visit via Telephone Note  I connected with pt on 03/16/21 at  9:40 AM EDT by telephone and verified that I am speaking with the correct person using two identifiers.   I discussed the limitations, risks, security and privacy concerns of performing an evaluation and management service by telephone and the availability of in person appointments. I also discussed with the patient that there may be a patient responsible charge related to this service. The patient expressed understanding and agreed to proceed.   I discussed the assessment and treatment plan with the patient. The patient was provided an opportunity to ask questions and all were answered. The patient agreed with the plan and demonstrated an understanding of the instructions.   The patient was advised to call back or seek an in-person evaluation if the symptoms worsen or if the condition fails to improve as anticipated.  I provided 30 minutes of non-face-to-face time during this encounter.  The patient was located at home.  The provider was located at [place].   Aloha Gell, NP   Subjective:   Patient ID:  [name] is a 70 y.o. (DOB 1950-11-08) male.  Chief Complaint: No chief complaint on file.   HPI [name] presents for follow-up of anxiety, depression, insomnia, and delusional disorder.  Describes mood today as "ok". Pleasant. Mood symptoms - denies depression and irritability. Feels anxious at times. Has an anxiety attack "every once in a while". Stating "I'm doing good". Denies negative thoughts - decreased with increase in Risperdal. Recovering from Covid - several family members diagnosed with Covid. Has returned to work. He and wife doing well. Stable interest and motivation. Taking medications as prescribed and feels they continue to work well for him.   Energy levels stable. Active, does not have a regular exercise  regimen.  Enjoys usual interests and activities. He and wife doing well. Spending time with family. Mostly staying at home.  Appetite adequate. Weight stable. Sleeps well most nights. Averages 6 to 7 hours. Waking up during the night. Focus and concentration stable. Completing tasks. Managing aspects of household. Works full time for [employer]. Denies SI or HI.  Denies AH or VH.      Review of Systems:  Review of Systems  Musculoskeletal:  Negative for gait problem.  Neurological:  Negative for tremors.  Psychiatric/Behavioral:         Please refer to HPI   Medications: I have reviewed the patient's current medications.  Current Outpatient Medications  Medication Sig Dispense Refill   ALPRAZolam (XANAX) 0.5 MG tablet TAKE ONE TABLET BY MOUTH EVERY NIGHT AT BEDTIME AS NEEDED FOR ANXIETY 30 tablet 2   aspirin-acetaminophen-caffeine (EXCEDRIN MIGRAINE) 250-250-65 MG tablet Take 2 tablets by mouth every 6 (six) hours as needed for headache.     atorvastatin (LIPITOR) 20 MG tablet TAKE 1 TABLET (20 MG TOTAL) BY MOUTH DAILY.     CALCIUM PO Take 2 tablets by mouth daily.     diclofenac sodium (VOLTAREN) 1 % GEL Apply 2 g topically 4 (four) times daily. 3 Tube 3   escitalopram (LEXAPRO) 10 MG tablet TAKE ONE TABLET BY MOUTH DAILY 90 tablet 3   escitalopram (LEXAPRO) 20 MG tablet Take 1 tablet (20 mg total) by mouth daily. 30 tablet 5   GAVILYTE-N WITH FLAVOR PACK 420 g solution      MAGNESIUM PO Take 1 tablet by mouth daily.  risperiDONE (RISPERDAL) 3 MG tablet Take 1 tablet (3 mg total) by mouth at bedtime. 30 tablet 5   Testosterone (ANDROGEL PUMP) 20.25 MG/ACT (1.62%) GEL APPLY 4 PUMPS ONTO THE SKIN DAILY AS DIRECTED     Testosterone 12.5 MG/ACT (1%) GEL      traMADol (ULTRAM) 50 MG tablet Take 2 tablets (100 mg total) by mouth 4 (four) times daily. 240 tablet 5   No current facility-administered medications for this visit.    Medication Side Effects:  None  Allergies:  Allergies  Allergen Reactions   Monosodium Glutamate Other (See Comments)   Other     MSG-migraine, swelling   Penicillins Rash    Has patient had a PCN reaction causing immediate rash, facial/tongue/throat swelling, SOB or lightheadedness with hypotension: Yes Has patient had a PCN reaction causing severe rash involving mucus membranes or skin necrosis: Yes -all over body. Has patient had a PCN reaction that required hospitalization Pt was already in the hospital  Has patient had a PCN reaction occurring within the last 10 years: No If all of the above answers are "NO", then may proceed with Cephalosporin use.     Past Medical History:  Diagnosis Date   Anxiety    Cataract    Chronic back pain    Depression    Hyperlipidemia    Lumbar spondylosis    Migraines    Phimosis    Postlaminectomy syndrome, lumbar region    Testosterone deficiency    Thalassemia minor     Family History  Problem Relation Age of Onset   Kidney disease Mother    Pancreatic cancer Father    Colon cancer Neg Hx    Esophageal cancer Neg Hx    Rectal cancer Neg Hx    Stomach cancer Neg Hx     Social History   Socioeconomic History   Marital status: Married    Spouse name: Zachary Everett   Number of children: 4   Years of education: Not on file   Highest education level: Not on file  Occupational History   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  All children are adults, and live independently, but within a mile of the  patient.   Social Determinants of Health   Financial Resource Strain: Not on file  Food Insecurity: Not on file  Transportation Needs: Not on file  Physical Activity: Not on file  Stress: Not on file  Social Connections: Not on file  Intimate Partner Violence: Not on file    Past Medical History, Surgical history, Social history, and Family history were reviewed and updated as appropriate.   Please see review of systems for further details on the patient's review from today.   Objective:   Physical Exam:  There were no vitals taken for this visit.  Physical Exam Neurological:     Mental Status: He is alert and oriented to person, place,  and time.     Cranial Nerves: No dysarthria.  Psychiatric:        Attention and Perception: Attention and perception normal.        Mood and Affect: Mood normal.        Speech: Speech normal.        Behavior: Behavior is cooperative.        Thought Content: Thought content normal. Thought content is not paranoid or delusional. Thought content does not include homicidal or suicidal ideation. Thought content does not include homicidal or suicidal plan.        Cognition and Memory: Cognition and memory normal.        Judgment: Judgment normal.     Comments: Insight intact    Lab Review:     Component Value Date/Time   NA 141 11/01/2016 1346   K 3.9 11/01/2016 1346   CL 104 11/01/2016 1346   CO2 26 11/01/2016 1346   GLUCOSE 113 (H) 11/01/2016 1346   BUN 17 11/01/2016 1346   CREATININE 0.77 11/01/2016 1346   CALCIUM 9.4 11/01/2016 1346   PROT 8.2 (H) 11/01/2016 1346   ALBUMIN 4.5 11/01/2016 1346   AST 22 11/01/2016 1346   ALT 21 11/01/2016 1346   ALKPHOS 94 11/01/2016 1346   BILITOT 1.0 11/01/2016 1346   GFRNONAA >60 11/01/2016 1346   GFRAA >60 11/01/2016 1346       Component Value Date/Time   WBC 11.2 (H) 11/01/2016 1346   RBC 6.62 (H) 11/01/2016 1346   HGB 12.7 (L) 11/01/2016 1346   HCT 39.7 11/01/2016 1346   PLT 204  11/01/2016 1346   MCV 60.0 (L) 11/01/2016 1346   MCV 63.3 (A) 10/04/2013 1627   MCH 19.2 (L) 11/01/2016 1346   MCHC 32.0 11/01/2016 1346   RDW 16.9 (H) 11/01/2016 1346   LYMPHSABS 2.5 09/07/2015 1259   MONOABS 0.6 09/07/2015 1259   EOSABS 0.2 09/07/2015 1259   BASOSABS 0.1 09/07/2015 1259    No results found for: POCLITH, LITHIUM   No results found for: PHENYTOIN, PHENOBARB, VALPROATE, CBMZ   .res Assessment: Plan:     Plan:  1. Lexapro 10mg  daily 2. Risperdal 3mg  at hs  3. Xanax 0.5mg  at hs  RTC 6 months  Patient advised to contact office with any questions, adverse effects, or acute worsening in signs and symptoms.  Discussed potential benefits, risk, and side effects of benzodiazepines to include potential risk of tolerance and dependence, as well as possible drowsiness.  Advised patient not to drive if experiencing drowsiness and to take lowest possible effective dose to minimize risk of dependence and tolerance.  Discussed potential metabolic side effects associated with atypical antipsychotics, as well as potential risk for movement side effects. Advised pt to contact office if movement side effects occur.     Diagnoses and all orders for this visit:  Delusional disorder ([place]) -     risperiDONE (RISPERDAL) 3 MG tablet; Take 1 tablet (3 mg total) by mouth at bedtime.  Insomnia, unspecified type -     risperiDONE (RISPERDAL) 3 MG tablet; Take 1 tablet (3 mg total) by mouth at bedtime.  Major depressive disorder, recurrent episode, moderate (HCC) -     risperiDONE (RISPERDAL) 3 MG tablet; Take 1 tablet (3 mg total) by mouth at bedtime. -     escitalopram (LEXAPRO) 20 MG tablet; Take 1 tablet (20 mg total) by mouth daily.  Generalized anxiety disorder -     risperiDONE (RISPERDAL) 3 MG tablet;  Take 1 tablet (3 mg total) by mouth at bedtime. -     escitalopram (LEXAPRO) 20 MG tablet; Take 1 tablet (20 mg total) by mouth daily. -     ALPRAZolam (XANAX) 0.5 MG  tablet; TAKE ONE TABLET BY MOUTH EVERY NIGHT AT BEDTIME AS NEEDED FOR ANXIETY   Please see After Visit Summary for patient specific instructions.  No future appointments.   No orders of the defined types were placed in this encounter.     -------------------------------

## 2021-07-20 ENCOUNTER — Other Ambulatory Visit: Payer: Self-pay | Admitting: Adult Health

## 2021-07-20 DIAGNOSIS — F411 Generalized anxiety disorder: Secondary | ICD-10-CM

## 2021-07-21 NOTE — Telephone Encounter (Signed)
Please schedule appt

## 2021-07-28 NOTE — Telephone Encounter (Signed)
Last filled 9/14.  

## 2021-07-28 NOTE — Telephone Encounter (Signed)
Pt scheduled for December which is when he was suppose to schedule

## 2021-09-09 ENCOUNTER — Ambulatory Visit (INDEPENDENT_AMBULATORY_CARE_PROVIDER_SITE_OTHER): Payer: Medicare Other | Admitting: Adult Health

## 2021-09-09 ENCOUNTER — Encounter: Payer: Self-pay | Admitting: Adult Health

## 2021-09-09 DIAGNOSIS — G4700 Insomnia, unspecified: Secondary | ICD-10-CM | POA: Diagnosis not present

## 2021-09-09 DIAGNOSIS — F411 Generalized anxiety disorder: Secondary | ICD-10-CM

## 2021-09-09 DIAGNOSIS — F331 Major depressive disorder, recurrent, moderate: Secondary | ICD-10-CM

## 2021-09-09 DIAGNOSIS — F22 Delusional disorders: Secondary | ICD-10-CM | POA: Diagnosis not present

## 2021-09-09 MED ORDER — ESCITALOPRAM OXALATE 20 MG PO TABS: 20.0000 mg | ORAL_TABLET | Freq: Every day | ORAL | 5 refills | Status: DC

## 2021-09-09 MED ORDER — RISPERIDONE 3 MG PO TABS: 3.0000 mg | ORAL_TABLET | Freq: Every day | ORAL | 5 refills | Status: DC

## 2021-09-09 MED ORDER — ALPRAZOLAM 0.5 MG PO TABS: ORAL_TABLET | ORAL | 2 refills | Status: DC

## 2021-09-09 NOTE — Progress Notes (Signed)
Zachary Everett [id number] 01/23/1951 70 y.o.  Virtual Visit via Telephone Note  I connected with pt on 09/09/21 at  8:20 AM EST by telephone and verified that I am speaking with the correct person using two identifiers.   I discussed the limitations, risks, security and privacy concerns of performing an evaluation and management service by telephone and the availability of in person appointments. I also discussed with the patient that there may be a patient responsible charge related to this service. The patient expressed understanding and agreed to proceed.   I discussed the assessment and treatment plan with the patient. The patient was provided an opportunity to ask questions and all were answered. The patient agreed with the plan and demonstrated an understanding of the instructions.   The patient was advised to call back or seek an in-person evaluation if the symptoms worsen or if the condition fails to improve as anticipated.  I provided 25 minutes of non-face-to-face time during this encounter.  The patient was located at home.  The provider was located at [place].   Aloha Gell, NP   Subjective:   Patient ID:  Zachary Everett is a 70 y.o. (DOB [birth year]) male.  Chief Complaint: No chief complaint on file.   HPI Zachary Everett presents for follow-up of anxiety, depression, insomnia, and delusional disorder.  Describes mood today as "ok". Pleasant. Mood symptoms - denies depression and irritability. Feels anxious "once in a while". Denies panic attacks. Stating "I'm doing good". Feels like medications continue to work well. Stable interest and motivation. Taking medications as prescribed and feels they continue to work well for him.   Energy levels stable. Active, does not have a regular exercise regimen.  Enjoys usual interests and activities. He and wife doing well. Spending time with family. Mostly staying at home.  Appetite adequate. Weight  stable. Sleeps well most nights. Averages 6 to 7 hours. Waking up during the night. Focus and concentration stable. Completing tasks. Managing aspects of household. Works full time for [employer]. Denies SI or HI.  Denies AH or VH.  Review of Systems:  Review of Systems  Musculoskeletal:  Negative for gait problem.  Neurological:  Negative for tremors.  Psychiatric/Behavioral:         Please refer to HPI   Medications: I have reviewed the patient's current medications.  Current Outpatient Medications  Medication Sig Dispense Refill   ALPRAZolam (XANAX) 0.5 MG tablet TAKE ONE TABLET BY MOUTH EVERY NIGHT AT BEDTIME AS NEEDED FOR ANXIETY 30 tablet 2   aspirin-acetaminophen-caffeine (EXCEDRIN MIGRAINE) 250-250-65 MG tablet Take 2 tablets by mouth every 6 (six) hours as needed for headache.     atorvastatin (LIPITOR) 20 MG tablet TAKE 1 TABLET (20 MG TOTAL) BY MOUTH DAILY.     CALCIUM PO Take 2 tablets by mouth daily.     diclofenac sodium (VOLTAREN) 1 % GEL Apply 2 g topically 4 (four) times daily. 3 Tube 3   escitalopram (LEXAPRO) 20 MG tablet Take 1 tablet (20 mg total) by mouth daily. 30 tablet 5   GAVILYTE-N WITH FLAVOR PACK 420 g solution      MAGNESIUM PO Take 1 tablet by mouth daily.     risperiDONE (RISPERDAL) 3 MG tablet Take 1 tablet (3 mg total) by mouth at bedtime. 30 tablet 5   Testosterone (ANDROGEL PUMP) 20.25 MG/ACT (1.62%) GEL APPLY 4 PUMPS ONTO THE SKIN DAILY AS DIRECTED     Testosterone 12.5 MG/ACT (1%) GEL  traMADol (ULTRAM) 50 MG tablet Take 2 tablets (100 mg total) by mouth 4 (four) times daily. 240 tablet 5   No current facility-administered medications for this visit.    Medication Side Effects: None  Allergies:  Allergies  Allergen Reactions   Monosodium Glutamate Other (See Comments)   Other     MSG-migraine, swelling   Penicillins Rash    Has patient had a PCN reaction causing immediate rash, facial/tongue/throat swelling, SOB or  lightheadedness with hypotension: Yes Has patient had a PCN reaction causing severe rash involving mucus membranes or skin necrosis: Yes -all over body. Has patient had a PCN reaction that required hospitalization Pt was already in the hospital  Has patient had a PCN reaction occurring within the last 10 years: No If all of the above answers are "NO", then may proceed with Cephalosporin use.     Past Medical History:  Diagnosis Date   Anxiety    Cataract    Chronic back pain    Depression    Hyperlipidemia    Lumbar spondylosis    Migraines    Phimosis    Postlaminectomy syndrome, lumbar region    Testosterone deficiency    Thalassemia minor     Family History  Problem Relation Age of Onset   Kidney disease Mother    Pancreatic cancer Father    Colon cancer Neg Hx    Esophageal cancer Neg Hx    Rectal cancer Neg Hx    Stomach cancer Neg Hx     Social History   Socioeconomic History   Marital status: Married    Spouse name: [name]   Number of children: 4   Years of education: Not on file   Highest education level: Not on file  Occupational History   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  All children are adults, and live independently, but within a mile of the patient.   Social Determinants of Health   Financial Resource Strain: Not on file  Food Insecurity: Not on file  Transportation Needs: Not on file  Physical Activity: Not on file  Stress: Not on file  Social Connections: Not on file  Intimate Partner Violence:  Not on file    Past Medical History, Surgical history, Social history, and Family history were reviewed and updated as appropriate.   Please see review of systems for further details on the patient's review from today.   Objective:   Physical Exam:  There were no vitals taken for this visit.  Physical Exam Constitutional:      General: He is not in acute distress. Musculoskeletal:        General: No deformity.  Neurological:     Mental Status: He is alert and oriented to person, place, and time.     Coordination: Coordination normal.  Psychiatric:        Attention and Perception: Attention and perception normal. He does not perceive auditory or  visual hallucinations.        Mood and Affect: Mood normal. Mood is not anxious or depressed. Affect is not labile, blunt, angry or inappropriate.        Speech: Speech normal.        Behavior: Behavior normal.        Thought Content: Thought content normal. Thought content is not paranoid or delusional. Thought content does not include homicidal or suicidal ideation. Thought content does not include homicidal or suicidal plan.        Cognition and Memory: Cognition and memory normal.        Judgment: Judgment normal.     Comments: Insight intact    Lab Review:     Component Value Date/Time   NA 141 11/01/2016 1346   K 3.9 11/01/2016 1346   CL 104 11/01/2016 1346   CO2 26 11/01/2016 1346   GLUCOSE 113 (H) 11/01/2016 1346   BUN 17 11/01/2016 1346   CREATININE 0.77 11/01/2016 1346   CALCIUM 9.4 11/01/2016 1346   PROT 8.2 (H) 11/01/2016 1346   ALBUMIN 4.5 11/01/2016 1346   AST 22 11/01/2016 1346   ALT 21 11/01/2016 1346   ALKPHOS 94 11/01/2016 1346   BILITOT 1.0 11/01/2016 1346   GFRNONAA >60 11/01/2016 1346   GFRAA >60 11/01/2016 1346       Component Value Date/Time   WBC 11.2 (H) 11/01/2016 1346   RBC 6.62 (H) 11/01/2016 1346   HGB 12.7 (L) 11/01/2016 1346   HCT 39.7 11/01/2016 1346   PLT 204 11/01/2016 1346   MCV 60.0  (L) 11/01/2016 1346   MCV 63.3 (A) 10/04/2013 1627   MCH 19.2 (L) 11/01/2016 1346   MCHC 32.0 11/01/2016 1346   RDW 16.9 (H) 11/01/2016 1346   LYMPHSABS 2.5 09/07/2015 1259   MONOABS 0.6 09/07/2015 1259   EOSABS 0.2 09/07/2015 1259   BASOSABS 0.1 09/07/2015 1259    No results found for: POCLITH, LITHIUM   No results found for: PHENYTOIN, PHENOBARB, VALPROATE, CBMZ   .res Assessment: Plan:    Plan:  1. Lexapro 20mg  daily 2. Risperdal 3mg  at hs  3. Xanax 0.5mg  at hs  RTC 6 months  Patient advised to contact office with any questions, adverse effects, or acute worsening in signs and symptoms.  Discussed potential benefits, risk, and side effects of benzodiazepines to include potential risk of tolerance and dependence, as well as possible drowsiness.  Advised patient not to drive if experiencing drowsiness and to take lowest possible effective dose to minimize risk of dependence and tolerance.  Discussed potential metabolic side effects associated with atypical antipsychotics, as well as potential risk for movement side effects. Advised pt to contact office if movement side effects occur.   Diagnoses and all orders for this visit:  Delusional disorder ([place]) -     risperiDONE (RISPERDAL) 3 MG tablet; Take 1 tablet (3 mg total) by mouth at bedtime.  Insomnia, unspecified type -     risperiDONE (RISPERDAL) 3 MG tablet; Take 1 tablet (3 mg total) by mouth at bedtime.  Major depressive disorder, recurrent episode, moderate (HCC) -     escitalopram (LEXAPRO) 20 MG tablet; Take 1 tablet (20 mg total) by mouth daily. -     risperiDONE (RISPERDAL) 3 MG tablet; Take 1 tablet (3 mg total) by mouth at bedtime.  Generalized anxiety disorder -     escitalopram (LEXAPRO) 20 MG tablet; Take 1 tablet (20 mg total) by mouth daily. -  risperiDONE (RISPERDAL) 3 MG tablet; Take 1 tablet (3 mg total) by mouth at bedtime. -     ALPRAZolam (XANAX) 0.5 MG tablet; TAKE ONE TABLET BY MOUTH EVERY  NIGHT AT BEDTIME AS NEEDED FOR ANXIETY   Please see After Visit Summary for patient specific instructions.  No future appointments.  No orders of the defined types were placed in this encounter.     -------------------------------

## 2022-01-04 ENCOUNTER — Other Ambulatory Visit: Payer: Self-pay | Admitting: Adult Health

## 2022-01-04 DIAGNOSIS — F331 Major depressive disorder, recurrent, moderate: Secondary | ICD-10-CM

## 2022-01-04 DIAGNOSIS — F411 Generalized anxiety disorder: Secondary | ICD-10-CM

## 2022-02-11 ENCOUNTER — Other Ambulatory Visit: Payer: Self-pay | Admitting: Adult Health

## 2022-02-11 DIAGNOSIS — F411 Generalized anxiety disorder: Secondary | ICD-10-CM

## 2022-02-12 NOTE — Telephone Encounter (Signed)
Patient scheduled for 6/7 ?

## 2022-02-12 NOTE — Telephone Encounter (Signed)
Should follow up June/July ?

## 2022-02-12 NOTE — Telephone Encounter (Signed)
Please get scheduled for 6 month follow up June/July ?

## 2022-03-10 ENCOUNTER — Encounter: Payer: Self-pay | Admitting: Adult Health

## 2022-03-10 ENCOUNTER — Ambulatory Visit: Payer: Medicare Other | Admitting: Adult Health

## 2022-03-10 DIAGNOSIS — F411 Generalized anxiety disorder: Secondary | ICD-10-CM | POA: Diagnosis not present

## 2022-03-10 DIAGNOSIS — F331 Major depressive disorder, recurrent, moderate: Secondary | ICD-10-CM

## 2022-03-10 DIAGNOSIS — F22 Delusional disorders: Secondary | ICD-10-CM

## 2022-03-10 DIAGNOSIS — G4700 Insomnia, unspecified: Secondary | ICD-10-CM

## 2022-03-10 MED ORDER — RISPERIDONE 3 MG PO TABS: 3.0000 mg | ORAL_TABLET | Freq: Every day | ORAL | 1 refills | Status: DC

## 2022-03-10 MED ORDER — ESCITALOPRAM OXALATE 20 MG PO TABS: 20.0000 mg | ORAL_TABLET | Freq: Every day | ORAL | 1 refills | Status: DC

## 2022-03-10 MED ORDER — ALPRAZOLAM 0.5 MG PO TABS: ORAL_TABLET | ORAL | 2 refills | Status: DC

## 2022-03-10 NOTE — Progress Notes (Signed)
Zachary Everett [id number] 1950/12/09 71 y.o.  Virtual Visit via Telephone Note  I connected with pt on 03/10/22 at  8:00 AM EDT by telephone and verified that I am speaking with the correct person using two identifiers.   I discussed the limitations, risks, security and privacy concerns of performing an evaluation and management service by telephone and the availability of in person appointments. I also discussed with the patient that there may be a patient responsible charge related to this service. The patient expressed understanding and agreed to proceed.   I discussed the assessment and treatment plan with the patient. The patient was provided an opportunity to ask questions and all were answered. The patient agreed with the plan and demonstrated an understanding of the instructions.   The patient was advised to call back or seek an in-person evaluation if the symptoms worsen or if the condition fails to improve as anticipated.  I provided 25 minutes of non-face-to-face time during this encounter.  The patient was located at home.  The provider was located at [place].   Aloha Gell, NP   Subjective:   Patient ID:  Zachary Everett is a 71 y.o. (DOB 1951-04-10) male.  Chief Complaint: No chief complaint on file.   HPI Zachary Everett presents for follow-up of anxiety, depression, insomnia, and delusional disorder.  Describes mood today as "ok". Pleasant. Mood symptoms - denies depression and irritability. Feels anxious "every now and then". Denies recent panic attacks - "every now and then" . Stating "I'm doing good". Feels like medications continue to work well. Stable interest and motivation. Taking medications as prescribed and feels they continue to work well for him.   Energy levels stable. Active, does not have a regular exercise regimen.  Enjoys usual interests and activities. Married - lives with wife. He and wife doing well. Spending time with  family - beach trip planned with family in August..  Appetite adequate. Weight stable. Sleeps well most nights. Averages 6 to 7 hours.  Focus and concentration stable. Completing tasks. Managing aspects of household. Works full time for [employer]. Denies SI or HI.  Denies AH or VH. Denies paranoia.   Review of Systems:  Review of Systems  Musculoskeletal:  Negative for gait problem.  Neurological:  Negative for tremors.  Psychiatric/Behavioral:         Please refer to HPI   Medications: I have reviewed the patient's current medications.  Current Outpatient Medications  Medication Sig Dispense Refill   ALPRAZolam (XANAX) 0.5 MG tablet TAKE ONE TABLET BY MOUTH EVERY NIGHT AT BEDTIME AS NEEDED FOR ANXIETY 30 tablet 2   aspirin-acetaminophen-caffeine (EXCEDRIN MIGRAINE) 250-250-65 MG tablet Take 2 tablets by mouth every 6 (six) hours as needed for headache.     atorvastatin (LIPITOR) 20 MG tablet TAKE 1 TABLET (20 MG TOTAL) BY MOUTH DAILY.     CALCIUM PO Take 2 tablets by mouth daily.     diclofenac sodium (VOLTAREN) 1 % GEL Apply 2 g topically 4 (four) times daily. 3 Tube 3   escitalopram (LEXAPRO) 20 MG tablet Take 1 tablet (20 mg total) by mouth daily. 90 tablet 1   GAVILYTE-N WITH FLAVOR PACK 420 g solution      MAGNESIUM PO Take 1 tablet by mouth daily.     risperiDONE (RISPERDAL) 3 MG tablet Take 1 tablet (3 mg total) by mouth at bedtime. 90 tablet 1   Testosterone (ANDROGEL PUMP) 20.25 MG/ACT (1.62%) GEL APPLY 4 PUMPS  ONTO THE SKIN DAILY AS DIRECTED     Testosterone 12.5 MG/ACT (1%) GEL      traMADol (ULTRAM) 50 MG tablet Take 2 tablets (100 mg total) by mouth 4 (four) times daily. 240 tablet 5   No current facility-administered medications for this visit.    Medication Side Effects: None  Allergies:  Allergies  Allergen Reactions   Monosodium Glutamate Other (See Comments)   Other     MSG-migraine, swelling   Penicillins Rash    Has patient had a PCN  reaction causing immediate rash, facial/tongue/throat swelling, SOB or lightheadedness with hypotension: Yes Has patient had a PCN reaction causing severe rash involving mucus membranes or skin necrosis: Yes -all over body. Has patient had a PCN reaction that required hospitalization Pt was already in the hospital  Has patient had a PCN reaction occurring within the last 10 years: No If all of the above answers are "NO", then may proceed with Cephalosporin use.     Past Medical History:  Diagnosis Date   Anxiety    Cataract    Chronic back pain    Depression    Hyperlipidemia    Lumbar spondylosis    Migraines    Phimosis    Postlaminectomy syndrome, lumbar region    Testosterone deficiency    Thalassemia minor     Family History  Problem Relation Age of Onset   Kidney disease Mother    Pancreatic cancer Father    Colon cancer Neg Hx    Esophageal cancer Neg Hx    Rectal cancer Neg Hx    Stomach cancer Neg Hx     Social History   Socioeconomic History   Marital status: Married    Spouse name: [name]   Number of children: 4   Years of education: Not on file   Highest education level: Not on file  Occupational History   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  All children are adults, and live independently, but within a mile of the patient.   Social Determinants of Health   Financial Resource Strain: Not on file  Food Insecurity: Not on file  Transportation Needs: Not on file  Physical Activity: Not on file  Stress: Not  on file  Social Connections: Not on file  Intimate Partner Violence: Not on file    Past Medical History, Surgical history, Social history, and Family history were reviewed and updated as appropriate.   Please see review of systems for further details on the patient's review from today.   Objective:   Physical Exam:  There were no vitals taken for this visit.  Physical Exam Constitutional:      General: He is not in acute distress. Musculoskeletal:        General: No deformity.  Neurological:     Mental Status: He is alert and oriented to person, place, and time.     Coordination: Coordination normal.  Psychiatric:  Attention and Perception: Attention and perception normal. He does not perceive auditory or visual hallucinations.        Mood and Affect: Mood normal. Mood is not anxious or depressed. Affect is not labile, blunt, angry or inappropriate.        Speech: Speech normal.        Behavior: Behavior normal.        Thought Content: Thought content normal. Thought content is not paranoid or delusional. Thought content does not include homicidal or suicidal ideation. Thought content does not include homicidal or suicidal plan.        Cognition and Memory: Cognition and memory normal.        Judgment: Judgment normal.     Comments: Insight intact    Lab Review:     Component Value Date/Time   NA 141 11/01/2016 1346   K 3.9 11/01/2016 1346   CL 104 11/01/2016 1346   CO2 26 11/01/2016 1346   GLUCOSE 113 (H) 11/01/2016 1346   BUN 17 11/01/2016 1346   CREATININE 0.77 11/01/2016 1346   CALCIUM 9.4 11/01/2016 1346   PROT 8.2 (H) 11/01/2016 1346   ALBUMIN 4.5 11/01/2016 1346   AST 22 11/01/2016 1346   ALT 21 11/01/2016 1346   ALKPHOS 94 11/01/2016 1346   BILITOT 1.0 11/01/2016 1346   GFRNONAA >60 11/01/2016 1346   GFRAA >60 11/01/2016 1346       Component Value Date/Time   WBC 11.2 (H) 11/01/2016 1346   RBC 6.62 (H) 11/01/2016 1346   HGB 12.7 (L) 11/01/2016  1346   HCT 39.7 11/01/2016 1346   PLT 204 11/01/2016 1346   MCV 60.0 (L) 11/01/2016 1346   MCV 63.3 (A) 10/04/2013 1627   MCH 19.2 (L) 11/01/2016 1346   MCHC 32.0 11/01/2016 1346   RDW 16.9 (H) 11/01/2016 1346   LYMPHSABS 2.5 09/07/2015 1259   MONOABS 0.6 09/07/2015 1259   EOSABS 0.2 09/07/2015 1259   BASOSABS 0.1 09/07/2015 1259    No results found for: POCLITH, LITHIUM   No results found for: PHENYTOIN, PHENOBARB, VALPROATE, CBMZ   .res Assessment: Plan:    Plan:  1. Lexapro '20mg'$  daily 2. Risperdal '3mg'$  at hs  3. Xanax 0.'5mg'$  at hs  RTC 6 months  Patient advised to contact office with any questions, adverse effects, or acute worsening in signs and symptoms.  Discussed potential benefits, risk, and side effects of benzodiazepines to include potential risk of tolerance and dependence, as well as possible drowsiness.  Advised patient not to drive if experiencing drowsiness and to take lowest possible effective dose to minimize risk of dependence and tolerance.  Discussed potential metabolic side effects associated with atypical antipsychotics, as well as potential risk for movement side effects. Advised pt to contact office if movement side effects occur.   Diagnoses and all orders for this visit:  Delusional disorder ([place]) -     risperiDONE (RISPERDAL) 3 MG tablet; Take 1 tablet (3 mg total) by mouth at bedtime.  Insomnia, unspecified type -     risperiDONE (RISPERDAL) 3 MG tablet; Take 1 tablet (3 mg total) by mouth at bedtime.  Major depressive disorder, recurrent episode, moderate (HCC) -     risperiDONE (RISPERDAL) 3 MG tablet; Take 1 tablet (3 mg total) by mouth at bedtime. -     escitalopram (LEXAPRO) 20 MG tablet; Take 1 tablet (20 mg total) by mouth daily.  Generalized anxiety disorder -     risperiDONE (RISPERDAL) 3 MG tablet; Take  1 tablet (3 mg total) by mouth at bedtime. -     escitalopram (LEXAPRO) 20 MG tablet; Take 1 tablet (20 mg total) by mouth  daily. -     ALPRAZolam (XANAX) 0.5 MG tablet; TAKE ONE TABLET BY MOUTH EVERY NIGHT AT BEDTIME AS NEEDED FOR ANXIETY    Please see After Visit Summary for patient specific instructions.  No future appointments.   No orders of the defined types were placed in this encounter.     -------------------------------

## 2022-08-20 ENCOUNTER — Other Ambulatory Visit: Payer: Self-pay | Admitting: Adult Health

## 2022-08-20 DIAGNOSIS — F411 Generalized anxiety disorder: Secondary | ICD-10-CM

## 2022-09-09 ENCOUNTER — Encounter: Payer: Self-pay | Admitting: Adult Health

## 2022-09-09 ENCOUNTER — Ambulatory Visit: Payer: Medicare Other | Admitting: Adult Health

## 2022-09-09 DIAGNOSIS — F411 Generalized anxiety disorder: Secondary | ICD-10-CM

## 2022-09-09 DIAGNOSIS — G4700 Insomnia, unspecified: Secondary | ICD-10-CM | POA: Diagnosis not present

## 2022-09-09 DIAGNOSIS — F22 Delusional disorders: Secondary | ICD-10-CM

## 2022-09-09 DIAGNOSIS — F331 Major depressive disorder, recurrent, moderate: Secondary | ICD-10-CM

## 2022-09-09 MED ORDER — RISPERIDONE 3 MG PO TABS: 3.0000 mg | ORAL_TABLET | Freq: Every day | ORAL | 1 refills | Status: DC

## 2022-09-09 MED ORDER — ALPRAZOLAM 0.5 MG PO TABS: ORAL_TABLET | ORAL | 2 refills | Status: DC

## 2022-09-09 MED ORDER — ESCITALOPRAM OXALATE 20 MG PO TABS: 20.0000 mg | ORAL_TABLET | Freq: Every day | ORAL | 3 refills | Status: DC

## 2022-09-09 NOTE — Progress Notes (Signed)
[name] [id number] 1951/06/03 71 y.o.  Virtual Visit via Telephone Note  I connected with pt on 09/09/22 at  8:00 AM EST by telephone and verified that I am speaking with the correct person using two identifiers.   I discussed the limitations, risks, security and privacy concerns of performing an evaluation and management service by telephone and the availability of in person appointments. I also discussed with the patient that there may be a patient responsible charge related to this service. The patient expressed understanding and agreed to proceed.   I discussed the assessment and treatment plan with the patient. The patient was provided an opportunity to ask questions and all were answered. The patient agreed with the plan and demonstrated an understanding of the instructions.   The patient was advised to call back or seek an in-person evaluation if the symptoms worsen or if the condition fails to improve as anticipated.  I provided 25 minutes of non-face-to-face time during this encounter.  The patient was located at home.  The provider was located at [place].   Aloha Gell, NP   Subjective:   Patient ID:  Zachary Everett is a 71 y.o. (DOB January 06, 1951) male.  Chief Complaint: No chief complaint on file.   HPI KAIDEN PECH presents for follow-up of anxiety, depression, insomnia, and delusional disorder.  Describes mood today as "ok". Pleasant. Mood symptoms - denies depression and irritability. Feels anxious "sometimes". Denies recent panic attacks. Mood is consistent. Stating "everything is pretty steady". Feels like medications continue to work well. Stable interest and motivation. Taking medications as prescribed and feels they continue to work well for him.   Energy levels stable. Active, does not have a regular exercise regimen.  Enjoys usual interests and activities. Married - lives with wife. Spending time with family. Appetite  adequate. Weight stable. Sleeps well most nights. Averages 6 to 7 hours.  Focus and concentration stable. Completing tasks. Managing aspects of household. Works full time for [employer]. Denies SI or HI.  Denies AH or VH. Denies paranoia - "occasional negative thoughts".      Review of Systems:  Review of Systems  Musculoskeletal:  Negative for gait problem.  Neurological:  Negative for tremors.  Psychiatric/Behavioral:         Please refer to HPI    Medications: I have reviewed the patient's current medications.  Current Outpatient Medications  Medication Sig Dispense Refill   ALPRAZolam (XANAX) 0.5 MG tablet TAKE 1 TABLET BY MOUTH EVERY NIGHT AT BEDTIME AS NEEDED FOR ANXIETY. 30 tablet 1   aspirin-acetaminophen-caffeine (EXCEDRIN MIGRAINE) 503-546-56 MG tablet Take 2 tablets by mouth every 6 (six) hours as needed for headache.     atorvastatin (LIPITOR) 20 MG tablet TAKE 1 TABLET (20 MG TOTAL) BY MOUTH DAILY.     CALCIUM PO Take 2 tablets by mouth daily.     diclofenac sodium (VOLTAREN) 1 % GEL Apply 2 g topically 4 (four) times daily. 3 Tube 3   escitalopram (LEXAPRO) 20 MG tablet Take 1 tablet (20 mg total) by mouth daily. 90 tablet 1   GAVILYTE-N WITH FLAVOR PACK 420 g solution      MAGNESIUM PO Take 1 tablet by mouth daily.     risperiDONE (RISPERDAL) 3 MG tablet Take 1 tablet (3 mg total) by mouth at bedtime. 90 tablet 1   Testosterone (ANDROGEL PUMP) 20.25 MG/ACT (1.62%) GEL APPLY 4 PUMPS ONTO THE SKIN DAILY AS DIRECTED     Testosterone  12.5 MG/ACT (1%) GEL      traMADol (ULTRAM) 50 MG tablet Take 2 tablets (100 mg total) by mouth 4 (four) times daily. 240 tablet 5   No current facility-administered medications for this visit.    Medication Side Effects: None  Allergies:  Allergies  Allergen Reactions   Monosodium Glutamate Other (See Comments)   Other     MSG-migraine, swelling   Penicillins Rash    Has patient had a PCN reaction causing immediate  rash, facial/tongue/throat swelling, SOB or lightheadedness with hypotension: Yes Has patient had a PCN reaction causing severe rash involving mucus membranes or skin necrosis: Yes -all over body. Has patient had a PCN reaction that required hospitalization Pt was already in the hospital  Has patient had a PCN reaction occurring within the last 10 years: No If all of the above answers are "NO", then may proceed with Cephalosporin use.     Past Medical History:  Diagnosis Date   Anxiety    Cataract    Chronic back pain    Depression    Hyperlipidemia    Lumbar spondylosis    Migraines    Phimosis    Postlaminectomy syndrome, lumbar region    Testosterone deficiency    Thalassemia minor     Family History  Problem Relation Age of Onset   Kidney disease Mother    Pancreatic cancer Father    Colon cancer Neg Hx    Esophageal cancer Neg Hx    Rectal cancer Neg Hx    Stomach cancer Neg Hx     Social History   Socioeconomic History   Marital status: Married    Spouse name: Zachary Everett   Number of children: 4   Years of education: Not on file   Highest education level: Not on file  Occupational History   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    Comment: Owner  Tobacco Use   Smoking status: Former    Types: Cigarettes    Quit date: 02/23/1986    Years since quitting: 36.5   Smokeless tobacco: Never  Substance and Sexual Activity   Alcohol use: Yes    Alcohol/week: 3.0 standard drinks of alcohol    Types: 3 Glasses of wine per week   Drug use: Never   Sexual activity: Not on file  Other Topics Concern   Not on file  Social History Narrative   Lives with his wife.  All children are adults, and live independently, but within a mile of the patient.   Social Determinants of Health   Financial Resource Strain: Not on file  Food Insecurity: Not on file  Transportation Needs: Not on file  Physical Activity: Not on file  Stress: Not on file  Social  Connections: Not on file  Intimate Partner Violence: Not on file    Past Medical History, Surgical history, Social history, and Family history were reviewed and updated as appropriate.   Please see review of systems for further details on the patient's review from today.   Objective:   Physical Exam:  There were no vitals taken for this visit.  Physical Exam Constitutional:      General: He is not in acute distress. Musculoskeletal:        General: No deformity.  Neurological:     Mental Status: He is alert and oriented to person, place, and time.     Coordination: Coordination normal.  Psychiatric:        Attention and  Perception: Attention and perception normal. He does not perceive auditory or visual hallucinations.        Mood and Affect: Mood normal. Mood is not anxious or depressed. Affect is not labile, blunt, angry or inappropriate.        Speech: Speech normal.        Behavior: Behavior normal.        Thought Content: Thought content normal. Thought content is not paranoid or delusional. Thought content does not include homicidal or suicidal ideation. Thought content does not include homicidal or suicidal plan.        Cognition and Memory: Cognition and memory normal.        Judgment: Judgment normal.     Comments: Insight intact     Lab Review:     Component Value Date/Time   NA 141 11/01/2016 1346   K 3.9 11/01/2016 1346   CL 104 11/01/2016 1346   CO2 26 11/01/2016 1346   GLUCOSE 113 (H) 11/01/2016 1346   BUN 17 11/01/2016 1346   CREATININE 0.77 11/01/2016 1346   CALCIUM 9.4 11/01/2016 1346   PROT 8.2 (H) 11/01/2016 1346   ALBUMIN 4.5 11/01/2016 1346   AST 22 11/01/2016 1346   ALT 21 11/01/2016 1346   ALKPHOS 94 11/01/2016 1346   BILITOT 1.0 11/01/2016 1346   GFRNONAA >60 11/01/2016 1346   GFRAA >60 11/01/2016 1346       Component Value Date/Time   WBC 11.2 (H) 11/01/2016 1346   RBC 6.62 (H) 11/01/2016 1346   HGB 12.7 (L) 11/01/2016 1346   HCT  39.7 11/01/2016 1346   PLT 204 11/01/2016 1346   MCV 60.0 (L) 11/01/2016 1346   MCV 63.3 (A) 10/04/2013 1627   MCH 19.2 (L) 11/01/2016 1346   MCHC 32.0 11/01/2016 1346   RDW 16.9 (H) 11/01/2016 1346   LYMPHSABS 2.5 09/07/2015 1259   MONOABS 0.6 09/07/2015 1259   EOSABS 0.2 09/07/2015 1259   BASOSABS 0.1 09/07/2015 1259    No results found for: "POCLITH", "LITHIUM"   No results found for: "PHENYTOIN", "PHENOBARB", "VALPROATE", "CBMZ"   .res Assessment: Plan:    Plan:  1. Lexapro '20mg'$  daily 2. Risperdal '3mg'$  at hs  3. Xanax 0.'5mg'$  at hs - last filled 08/21/2022 - 02/12/2022  RTC 6 months  Patient advised to contact office with any questions, adverse effects, or acute worsening in signs and symptoms.  Discussed potential benefits, risk, and side effects of benzodiazepines to include potential risk of tolerance and dependence, as well as possible drowsiness.  Advised patient not to drive if experiencing drowsiness and to take lowest possible effective dose to minimize risk of dependence and tolerance.  Discussed potential metabolic side effects associated with atypical antipsychotics, as well as potential risk for movement side effects. Advised pt to contact office if movement side effects occur.  There are no diagnoses linked to this encounter.  Please see After Visit Summary for patient specific instructions.  Future Appointments  Date Time Provider [place]  09/09/2022  8:00 AM Jahzara Slattery, Berdie Ogren, NP [facility] None    No orders of the defined types were placed in this encounter.     -------------------------------

## 2023-01-18 ENCOUNTER — Other Ambulatory Visit: Payer: Self-pay | Admitting: Adult Health

## 2023-01-18 DIAGNOSIS — F411 Generalized anxiety disorder: Secondary | ICD-10-CM

## 2023-03-09 ENCOUNTER — Other Ambulatory Visit: Payer: Self-pay | Admitting: Adult Health

## 2023-03-09 DIAGNOSIS — F331 Major depressive disorder, recurrent, moderate: Secondary | ICD-10-CM

## 2023-03-09 DIAGNOSIS — F22 Delusional disorders: Secondary | ICD-10-CM

## 2023-03-09 DIAGNOSIS — F411 Generalized anxiety disorder: Secondary | ICD-10-CM

## 2023-03-09 DIAGNOSIS — G4700 Insomnia, unspecified: Secondary | ICD-10-CM

## 2023-03-18 ENCOUNTER — Encounter: Payer: Self-pay | Admitting: Adult Health

## 2023-03-18 ENCOUNTER — Ambulatory Visit (INDEPENDENT_AMBULATORY_CARE_PROVIDER_SITE_OTHER): Payer: Medicare Other | Admitting: Adult Health

## 2023-03-18 DIAGNOSIS — F331 Major depressive disorder, recurrent, moderate: Secondary | ICD-10-CM | POA: Diagnosis not present

## 2023-03-18 DIAGNOSIS — F22 Delusional disorders: Secondary | ICD-10-CM

## 2023-03-18 DIAGNOSIS — F411 Generalized anxiety disorder: Secondary | ICD-10-CM

## 2023-03-18 MED ORDER — ALPRAZOLAM 0.5 MG PO TABS
ORAL_TABLET | ORAL | 2 refills | Status: DC
Start: 2023-03-18 — End: 2023-06-20

## 2023-03-18 MED ORDER — ESCITALOPRAM OXALATE 20 MG PO TABS: 20.0000 mg | ORAL_TABLET | Freq: Every day | ORAL | 3 refills | Status: DC

## 2023-03-18 MED ORDER — RISPERIDONE 3 MG PO TABS
3.0000 mg | ORAL_TABLET | Freq: Every day | ORAL | 3 refills | Status: DC
Start: 2023-03-18 — End: 2024-03-13

## 2023-03-18 NOTE — Progress Notes (Addendum)
Zachary Everett [id number] [birth year] 72 y.o.  Virtual Visit via Telephone Note  I connected with pt on 03/18/23 at  8:00 PM EDT by telephone and verified that I am speaking with the correct person using two identifiers.   I discussed the limitations, risks, security and privacy concerns of performing an evaluation and management service by telephone and the availability of in person appointments. I also discussed with the patient that there may be a patient responsible charge related to this service. The patient expressed understanding and agreed to proceed.   I discussed the assessment and treatment plan with the patient. The patient was provided an opportunity to ask questions and all were answered. The patient agreed with the plan and demonstrated an understanding of the instructions.   The patient was advised to call back or seek an in-person evaluation if the symptoms worsen or if the condition fails to improve as anticipated.  I provided 15 minutes of non-face-to-face time during this encounter.  The patient was located at home.  The provider was located at [facility].   Dorothyann Gibbs, NP   Subjective:   Patient ID:  Zachary Everett is a 72 y.o. (DOB 08-13-1951) male.  Chief Complaint: No chief complaint on file.   HPI Zachary Everett presents to the office today for follow-up of anxiety, depression, insomnia, and delusional disorder.  Describes mood today as "ok". Pleasant. Mood symptoms - denies depression and irritability. Feels anxious at times. Reports mild panic attacks - twice a month. Mood is consistent. Stating "I'm doing good". Feels like medications continue to work well. Stable interest and motivation. Taking medications as prescribed and feels they continue to work well for him.   Energy levels stable. Active, does not have a regular exercise regimen.  Enjoys usual interests and activities. Married - lives with wife. Spending time with  family. Appetite adequate. Weight stable. Sleeps well most nights. Averages 6 to 7 hours.  Focus and concentration stable. Completing tasks. Managing aspects of household. Works full time for [employer]. Denies SI or HI.  Denies AH or VH. Denies paranoia.  PHQ2-9    Flow[facility]  [facility]-2 Total Score 0        Review of Systems:  Review of Systems  Musculoskeletal:  Negative for gait problem.  Neurological:  Negative for tremors.  Psychiatric/Behavioral:         Please refer to HPI    Medications: I have reviewed the patient's current medications.  Current Outpatient Medications  Medication Sig Dispense Refill   ALPRAZolam (XANAX) 0.5 MG tablet TAKE ONE TABLET BY MOUTH EVERY NIGHT AT BEDTIME AS NEEDED FOR ANXIETY 30 tablet 1   aspirin-acetaminophen-caffeine (EXCEDRIN MIGRAINE) 250-250-65 MG tablet Take 2 tablets by mouth every 6 (six) hours as needed for headache.     atorvastatin (LIPITOR) 20 MG tablet TAKE 1 TABLET (20 MG TOTAL) BY MOUTH DAILY.     CALCIUM PO Take 2 tablets by mouth daily.     diclofenac sodium (VOLTAREN) 1 % GEL Apply 2 g topically 4 (four) times daily. 3 Tube 3   escitalopram (LEXAPRO) 20 MG tablet Take 1 tablet (20 mg total) by mouth daily. 90 tablet 3   GAVILYTE-N WITH FLAVOR PACK 420 g solution      MAGNESIUM PO Take 1 tablet by mouth daily.     risperiDONE (RISPERDAL) 3 MG tablet TAKE 1 TABLET BY MOUTH AT BEDTIME 90 tablet  0   Testosterone (ANDROGEL PUMP) 20.25 MG/ACT (1.62%) GEL APPLY 4 PUMPS ONTO THE SKIN DAILY AS DIRECTED     Testosterone 12.5 MG/ACT (1%) GEL      traMADol (ULTRAM) 50 MG tablet Take 2 tablets (100 mg total) by mouth 4 (four) times daily. 240 tablet 5   No current facility-administered medications for this visit.    Medication Side Effects: None  Allergies:  Allergies  Allergen Reactions   Monosodium Glutamate Other (See Comments)   Other      MSG-migraine, swelling   Penicillins Rash    Has patient had a PCN reaction causing immediate rash, facial/tongue/throat swelling, SOB or lightheadedness with hypotension: Yes Has patient had a PCN reaction causing severe rash involving mucus membranes or skin necrosis: Yes -all over body. Has patient had a PCN reaction that required hospitalization Pt was already in the hospital  Has patient had a PCN reaction occurring within the last 10 years: No If all of the above answers are "NO", then may proceed with Cephalosporin use.     Past Medical History:  Diagnosis Date   Anxiety    Cataract    Chronic back pain    Depression    Hyperlipidemia    Lumbar spondylosis    Migraines    Phimosis    Postlaminectomy syndrome, lumbar region    Testosterone deficiency    Thalassemia minor     Past Medical History, Surgical history, Social history, and Family history were reviewed and updated as appropriate.   Please see review of systems for further details on the patient's review from today.   Objective:   Physical Exam:  There were no vitals taken for this visit.  Physical Exam Constitutional:      General: He is not in acute distress. Musculoskeletal:        General: No deformity.  Neurological:     Mental Status: He is alert and oriented to person, place, and time.     Coordination: Coordination normal.  Psychiatric:        Attention and Perception: Attention and perception normal. He does not perceive auditory or visual hallucinations.        Mood and Affect: Mood normal. Mood is not anxious or depressed. Affect is not labile, blunt, angry or inappropriate.        Speech: Speech normal.        Behavior: Behavior normal.        Thought Content: Thought content normal. Thought content is not paranoid or delusional. Thought content does not include homicidal or suicidal ideation. Thought content does not include homicidal or suicidal plan.        Cognition and Memory: Cognition  and memory normal.        Judgment: Judgment normal.     Comments: Insight intact     Lab Review:     Component Value Date/Time   NA 141 11/01/2016 1346   K 3.9 11/01/2016 1346   CL 104 11/01/2016 1346   CO2 26 11/01/2016 1346   GLUCOSE 113 (H) 11/01/2016 1346   BUN 17 11/01/2016 1346   CREATININE 0.77 11/01/2016 1346   CALCIUM 9.4 11/01/2016 1346   PROT 8.2 (H) 11/01/2016 1346   ALBUMIN 4.5 11/01/2016 1346   AST 22 11/01/2016 1346   ALT 21 11/01/2016 1346   ALKPHOS 94 11/01/2016 1346   BILITOT 1.0 11/01/2016 1346   GFRNONAA >60 11/01/2016 1346   GFRAA >60 11/01/2016 1346  Component Value Date/Time   WBC 11.2 (H) 11/01/2016 1346   RBC 6.62 (H) 11/01/2016 1346   HGB 12.7 (L) 11/01/2016 1346   HCT 39.7 11/01/2016 1346   PLT 204 11/01/2016 1346   MCV 60.0 (L) 11/01/2016 1346   MCV 63.3 (A) 10/04/2013 1627   MCH 19.2 (L) 11/01/2016 1346   MCHC 32.0 11/01/2016 1346   RDW 16.9 (H) 11/01/2016 1346   LYMPHSABS 2.5 09/07/2015 1259   MONOABS 0.6 09/07/2015 1259   EOSABS 0.2 09/07/2015 1259   BASOSABS 0.1 09/07/2015 1259    No results found for: "POCLITH", "LITHIUM"   No results found for: "PHENYTOIN", "PHENOBARB", "VALPROATE", "CBMZ"   .res Assessment: Plan:    Plan:  1. Lexapro 20mg  daily 2. Risperdal 3mg  at hs  3. Xanax 0.5mg  at hs - last filled 02/19/2023  Testosterone injections  RTC 6 months  Patient advised to contact office with any questions, adverse effects, or acute worsening in signs and symptoms.  Discussed potential benefits, risk, and side effects of benzodiazepines to include potential risk of tolerance and dependence, as well as possible drowsiness.  Advised patient not to drive if experiencing drowsiness and to take lowest possible effective dose to minimize risk of dependence and tolerance.  Discussed potential metabolic side effects associated with atypical antipsychotics, as well as potential risk for movement side effects. Advised pt  to contact office if movement side effects occur.   There are no diagnoses linked to this encounter.   Please see After Visit Summary for patient specific instructions.  Future Appointments  Date Time Provider Department Center  03/18/2023  8:00 AM Shermon Bozzi, Thereasa Solo, NP CP-CP None    No orders of the defined types were placed in this encounter.   -------------------------------

## 2023-06-18 ENCOUNTER — Other Ambulatory Visit: Payer: Self-pay | Admitting: Adult Health

## 2023-06-18 DIAGNOSIS — F411 Generalized anxiety disorder: Secondary | ICD-10-CM

## 2023-09-16 ENCOUNTER — Ambulatory Visit: Payer: Medicare Other | Admitting: Adult Health

## 2023-09-16 ENCOUNTER — Encounter: Payer: Self-pay | Admitting: Adult Health

## 2023-09-16 DIAGNOSIS — F419 Anxiety disorder, unspecified: Secondary | ICD-10-CM | POA: Diagnosis not present

## 2023-09-16 DIAGNOSIS — F411 Generalized anxiety disorder: Secondary | ICD-10-CM

## 2023-09-16 DIAGNOSIS — G4700 Insomnia, unspecified: Secondary | ICD-10-CM | POA: Diagnosis not present

## 2023-09-16 DIAGNOSIS — F22 Delusional disorders: Secondary | ICD-10-CM

## 2023-09-16 DIAGNOSIS — F32A Depression, unspecified: Secondary | ICD-10-CM

## 2023-09-16 DIAGNOSIS — F331 Major depressive disorder, recurrent, moderate: Secondary | ICD-10-CM

## 2023-09-16 MED ORDER — ALPRAZOLAM 0.5 MG PO TABS
ORAL_TABLET | ORAL | 2 refills | Status: DC
Start: 2023-09-16 — End: 2023-12-28

## 2023-09-16 NOTE — Progress Notes (Signed)
Zachary Everett [id number] August 19, 1951 72 y.o.  Virtual Visit via Telephone Note  I connected with pt on 09/16/23 at  8:00 AM EST by telephone and verified that I am speaking with the correct person using two identifiers.   I discussed the limitations, risks, security and privacy concerns of performing an evaluation and management service by telephone and the availability of in person appointments. I also discussed with the patient that there may be a patient responsible charge related to this service. The patient expressed understanding and agreed to proceed.   I discussed the assessment and treatment plan with the patient. The patient was provided an opportunity to ask questions and all were answered. The patient agreed with the plan and demonstrated an understanding of the instructions.   The patient was advised to call back or seek an in-person evaluation if the symptoms worsen or if the condition fails to improve as anticipated.  I provided 25 minutes of non-face-to-face time during this encounter.  The patient was located at home.  The provider was located at [facility].   Dorothyann Gibbs, NP   Subjective:   Patient ID:  Zachary Everett is a 72 y.o. (DOB 1951-04-20) male.  Chief Complaint: No chief complaint on file.   HPI Zachary Everett presents for follow-up of  anxiety, depression, insomnia, and delusional disorder.  Describes mood today as "ok". Pleasant. Mood symptoms - denies depression, anxiety and irritability. Reports decreased panic attacks. Denies worry, rumination, and over thinking. Reports some negative thoughts - something happening to his family. Mood is consistent. Stating "I feel like I'm doing well". Feels like medications continue to work well. Stable interest and motivation. Taking medications as prescribed and feels they continue to work well for him.   Energy levels stable. Active, does not have a regular exercise regimen.  Enjoys usual  interests and activities. Married - lives with wife. Spending time with family. Appetite adequate. Weight stable. Sleeps well most nights. Averages 6 to 7 hours.  Focus and concentration stable. Completing tasks. Managing aspects of household. Works full time for [employer]. Denies SI or HI.  Denies AH or VH. Denies paranoia. Denies self harm. Denies substance use.  Review of Systems:  Review of Systems  Musculoskeletal:  Negative for gait problem.  Neurological:  Negative for tremors.  Psychiatric/Behavioral:         Please refer to HPI    Medications: I have reviewed the patient's current medications.  Current Outpatient Medications  Medication Sig Dispense Refill   ALPRAZolam (XANAX) 0.5 MG tablet TAKE 1 TABLET BY MOUTH EVERY NIGHT AT BEDTIME AS NEEDED FOR ANXIETY 30 tablet 2   aspirin-acetaminophen-caffeine (EXCEDRIN MIGRAINE) 250-250-65 MG tablet Take 2 tablets by mouth every 6 (six) hours as needed for headache.     atorvastatin (LIPITOR) 20 MG tablet TAKE 1 TABLET (20 MG TOTAL) BY MOUTH DAILY.     CALCIUM PO Take 2 tablets by mouth daily.     diclofenac sodium (VOLTAREN) 1 % GEL Apply 2 g topically 4 (four) times daily. 3 Tube 3   escitalopram (LEXAPRO) 20 MG tablet Take 1 tablet (20 mg total) by mouth daily. 90 tablet 3   GAVILYTE-N WITH FLAVOR PACK 420 g solution      MAGNESIUM PO Take 1 tablet by mouth daily.     risperiDONE (RISPERDAL) 3 MG tablet Take 1 tablet (3 mg total) by mouth at bedtime. 90 tablet 3   Testosterone (ANDROGEL PUMP) 20.25 MG/ACT (1.62%)  GEL APPLY 4 PUMPS ONTO THE SKIN DAILY AS DIRECTED     Testosterone 12.5 MG/ACT (1%) GEL      traMADol (ULTRAM) 50 MG tablet Take 2 tablets (100 mg total) by mouth 4 (four) times daily. 240 tablet 5   No current facility-administered medications for this visit.    Medication Side Effects: None  Allergies:  Allergies  Allergen Reactions   Monosodium Glutamate Other (See Comments)   Other      MSG-migraine, swelling   Penicillins Rash    Has patient had a PCN reaction causing immediate rash, facial/tongue/throat swelling, SOB or lightheadedness with hypotension: Yes Has patient had a PCN reaction causing severe rash involving mucus membranes or skin necrosis: Yes -all over body. Has patient had a PCN reaction that required hospitalization Pt was already in the hospital  Has patient had a PCN reaction occurring within the last 10 years: No If all of the above answers are "NO", then may proceed with Cephalosporin use.     Past Medical History:  Diagnosis Date   Anxiety    Cataract    Chronic back pain    Depression    Hyperlipidemia    Lumbar spondylosis    Migraines    Phimosis    Postlaminectomy syndrome, lumbar region    Testosterone deficiency    Thalassemia minor     Family History  Problem Relation Age of Onset   Kidney disease Mother    Pancreatic cancer Father    Colon cancer Neg Hx    Esophageal cancer Neg Hx    Rectal cancer Neg Hx    Stomach cancer Neg Hx     Social History   Socioeconomic History   Marital status: Married    Spouse name: Bonita Quin   Number of children: 4   Years of education: Not on file   Highest education level: Not on file  Occupational History   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  All children are adults, and live independently, but within a mile of the patient.   Social Drivers of Corporate investment banker Strain: Low Risk  (02/07/2023)    Received from [facility], [facility]   Overall Financial Resource Strain (CARDIA)    Difficulty of Paying Living Expenses: Not very hard  Food Insecurity: No Food Insecurity (02/07/2023)   Received from [facility], [facility]   Hunger Vital Sign    Worried About Running Out of Food in the Last Year: Never true    Ran Out of Food in the Last Year: Never true  Transportation Needs: No Transportation Needs (02/07/2023)   Received from [facility], [facility]   PRAPARE - Transportation    Lack of Transportation (Medical): No    Lack of Transportation (Non-Medical): No  Physical Activity: Insufficiently Active (02/07/2023)   Received from [facility]   Exercise Vital  Sign    Days of Exercise per Week: 1 day    Minutes of Exercise per Session: 30 min  Stress: No Stress Concern Present (02/07/2023)   Received from [facility], [facility] - Occupational Stress Questionnaire    Feeling of Stress : Not at all  Social Connections: Socially Integrated (02/07/2023)   Received from [facility], [facility]   Social Network    How would you rate your social network (family, work, friends)?: Good participation with social networks  Intimate Partner Violence: Not At Risk (02/07/2023)   Received from [facility], [facility]   HITS    Over the last 12 months how often did your partner physically hurt you?: Never    Over the last 12 months how often did your partner insult you or talk down to you?: Never    Over the last 12 months how often did your partner threaten you with physical harm?: Never    Over the last 12 months how often did your partner scream or curse at you?: Never    Past Medical History, Surgical history, Social history, and Family history were reviewed and updated as appropriate.   Please see review of systems for further details on the patient's review from today.   Objective:   Physical Exam:   There were no vitals taken for this visit.  Physical Exam Constitutional:      General: He is not in acute distress. Musculoskeletal:        General: No deformity.  Neurological:     Mental Status: He is alert and oriented to person, place, and time.     Coordination: Coordination normal.  Psychiatric:        Attention and Perception: Attention and perception normal. He does not perceive auditory or visual hallucinations.        Mood and Affect: Mood normal. Mood is not anxious or depressed. Affect is not labile, blunt, angry or inappropriate.        Speech: Speech normal.        Behavior: Behavior normal.        Thought Content: Thought content normal. Thought content is not paranoid or delusional. Thought content does not include homicidal or suicidal ideation. Thought content does not include homicidal or suicidal plan.        Cognition and Memory: Cognition and memory normal.        Judgment: Judgment normal.     Comments: Insight intact     Lab Review:     Component Value Date/Time   NA 141 11/01/2016 1346   K 3.9 11/01/2016 1346   CL 104 11/01/2016 1346   CO2 26 11/01/2016 1346   GLUCOSE 113 (H) 11/01/2016 1346   BUN 17 11/01/2016 1346   CREATININE 0.77 11/01/2016 1346   CALCIUM 9.4 11/01/2016 1346   PROT 8.2 (H) 11/01/2016 1346   ALBUMIN 4.5 11/01/2016 1346   AST 22 11/01/2016 1346   ALT 21 11/01/2016 1346   ALKPHOS 94 11/01/2016 1346   BILITOT 1.0 11/01/2016 1346   GFRNONAA >60 11/01/2016 1346   GFRAA >60 11/01/2016 1346       Component Value Date/Time   WBC 11.2 (H) 11/01/2016 1346   RBC 6.62 (H) 11/01/2016 1346   HGB 12.7 (L) 11/01/2016 1346   HCT 39.7 11/01/2016 1346   PLT 204 11/01/2016 1346   MCV 60.0 (L) 11/01/2016 1346   MCV 63.3 (A) 10/04/2013 1627   MCH  19.2 (L) 11/01/2016 1346   MCHC 32.0 11/01/2016 1346   RDW 16.9 (H) 11/01/2016 1346   LYMPHSABS 2.5 09/07/2015 1259   MONOABS 0.6 09/07/2015 1259   EOSABS 0.2 09/07/2015 1259   BASOSABS 0.1  09/07/2015 1259    No results found for: "POCLITH", "LITHIUM"   No results found for: "PHENYTOIN", "PHENOBARB", "VALPROATE", "CBMZ"   .res Assessment: Plan:    Plan:  1. Lexapro 20mg  daily 2. Risperdal 3mg  at hs  3. Xanax 0.5mg  at hs - last filled 02/19/2023  Testosterone injections  RTC 6 months  Patient advised to contact office with any questions, adverse effects, or acute worsening in signs and symptoms.  Discussed potential benefits, risk, and side effects of benzodiazepines to include potential risk of tolerance and dependence, as well as possible drowsiness.  Advised patient not to drive if experiencing drowsiness and to take lowest possible effective dose to minimize risk of dependence and tolerance.  Discussed potential metabolic side effects associated with atypical antipsychotics, as well as potential risk for movement side effects. Advised pt to contact office if movement side effects occur.  There are no diagnoses linked to this encounter.  Please see After Visit Summary for patient specific instructions.  No future appointments.  No orders of the defined types were placed in this encounter.     -------------------------------

## 2023-12-26 ENCOUNTER — Other Ambulatory Visit: Payer: Self-pay | Admitting: Adult Health

## 2023-12-26 DIAGNOSIS — F411 Generalized anxiety disorder: Secondary | ICD-10-CM

## 2024-03-13 ENCOUNTER — Encounter: Payer: Self-pay | Admitting: Adult Health

## 2024-03-13 ENCOUNTER — Ambulatory Visit: Payer: Medicare Other | Admitting: Adult Health

## 2024-03-13 DIAGNOSIS — G4700 Insomnia, unspecified: Secondary | ICD-10-CM

## 2024-03-13 DIAGNOSIS — F411 Generalized anxiety disorder: Secondary | ICD-10-CM

## 2024-03-13 DIAGNOSIS — F22 Delusional disorders: Secondary | ICD-10-CM | POA: Diagnosis not present

## 2024-03-13 DIAGNOSIS — F331 Major depressive disorder, recurrent, moderate: Secondary | ICD-10-CM

## 2024-03-13 MED ORDER — ALPRAZOLAM 0.5 MG PO TABS
ORAL_TABLET | ORAL | 2 refills | Status: DC
Start: 2024-03-13 — End: 2024-07-02

## 2024-03-13 MED ORDER — ESCITALOPRAM OXALATE 20 MG PO TABS
20.0000 mg | ORAL_TABLET | Freq: Every day | ORAL | 3 refills | Status: AC
Start: 2024-03-13 — End: ?

## 2024-03-13 MED ORDER — RISPERIDONE 3 MG PO TABS: 3.0000 mg | ORAL_TABLET | Freq: Every day | ORAL | 3 refills | Status: AC

## 2024-03-13 NOTE — Progress Notes (Signed)
 Zachary Everett [id number] 27-Sep-1951 73 y.o.  Virtual Visit via Telephone Note  I connected with pt on 03/13/24 at  8:00 AM EDT by telephone and verified that I am speaking with the correct person using two identifiers.   I discussed the limitations, risks, security and privacy concerns of performing an evaluation and management service by telephone and the availability of in person appointments. I also discussed with the patient that there may be a patient responsible charge related to this service. The patient expressed understanding and agreed to proceed.   I discussed the assessment and treatment plan with the patient. The patient was provided an opportunity to ask questions and all were answered. The patient agreed with the plan and demonstrated an understanding of the instructions.   The patient was advised to call back or seek an in-person evaluation if the symptoms worsen or if the condition fails to improve as anticipated.  I provided 25 minutes of non-face-to-face time during this encounter.  The patient was located at home.  The provider was located at [facility].   Zachary Camera, NP   Subjective:   Patient ID:  Zachary Everett is a 73 y.o. (DOB [birth year]) male.  Chief Complaint: No chief complaint on file.   HPI Zachary Everett presents for follow-up of  anxiety, depression, insomnia, and delusional disorder.  Describes mood today as "ok". Pleasant. Mood symptoms - reports a little depression at times. Reports stable interest and motivation. Denies anxiety and irritability. Reports anxiety "once in a while". Denies anxiety or panic attacks. Denies worry, rumination, and over thinking. Denies negative thoughts. Mood is consistent. Stating "I feel like I'm doing ok". Taking medications as prescribed.   Energy levels stable. Active, does not have a regular exercise regimen.  Enjoys usual interests and activities. Married - lives with wife. Spending  time with family. Appetite adequate. Weight stable. Sleeps well most nights. Averages 6 to 7 hours.  Focus and concentration stable. Completing tasks. Managing aspects of household. Works full time for [employer]. Denies SI or HI.  Denies AH or VH. Denies paranoia. Denies self harm. Denies substance use.   Review of Systems:  Review of Systems  Musculoskeletal:  Negative for gait problem.  Neurological:  Negative for tremors.  Psychiatric/Behavioral:         Please refer to HPI    Medications: I have reviewed the patient's current medications.  Current Outpatient Medications  Medication Sig Dispense Refill   ALPRAZolam  (XANAX ) 0.5 MG tablet TAKE 1 TABLET BY MOUTH EVERY NIGHT AT BEDTIME AS NEEDED FOR ANXIETY 30 tablet 2   aspirin-acetaminophen -caffeine (EXCEDRIN MIGRAINE) 250-250-65 MG tablet Take 2 tablets by mouth every 6 (six) hours as needed for headache.     atorvastatin (LIPITOR) 20 MG tablet TAKE 1 TABLET (20 MG TOTAL) BY MOUTH DAILY.     CALCIUM PO Take 2 tablets by mouth daily.     diclofenac  sodium (VOLTAREN ) 1 % GEL Apply 2 g topically 4 (four) times daily. 3 Tube 3   escitalopram  (LEXAPRO ) 20 MG tablet Take 1 tablet (20 mg total) by mouth daily. 90 tablet 3   GAVILYTE-N WITH FLAVOR PACK 420 g solution      MAGNESIUM PO Take 1 tablet by mouth daily.     risperiDONE  (RISPERDAL ) 3 MG tablet Take 1 tablet (3 mg total) by mouth at bedtime. 90 tablet 3   Testosterone (ANDROGEL PUMP) 20.25 MG/ACT (1.62%) GEL APPLY 4 PUMPS ONTO THE SKIN DAILY AS  DIRECTED     Testosterone 12.5 MG/ACT (1%) GEL      traMADol  (ULTRAM ) 50 MG tablet Take 2 tablets (100 mg total) by mouth 4 (four) times daily. 240 tablet 5   No current facility-administered medications for this visit.    Medication Side Effects: None  Allergies:  Allergies  Allergen Reactions   Monosodium Glutamate Other (See Comments)   Other     MSG-migraine, swelling   Penicillins Rash    Has patient had a  PCN reaction causing immediate rash, facial/tongue/throat swelling, SOB or lightheadedness with hypotension: Yes Has patient had a PCN reaction causing severe rash involving mucus membranes or skin necrosis: Yes -all over body. Has patient had a PCN reaction that required hospitalization Pt was already in the hospital  Has patient had a PCN reaction occurring within the last 10 years: No If all of the above answers are "NO", then may proceed with Cephalosporin use.     Past Medical History:  Diagnosis Date   Anxiety    Cataract    Chronic back pain    Depression    Hyperlipidemia    Lumbar spondylosis    Migraines    Phimosis    Postlaminectomy syndrome, lumbar region    Testosterone deficiency    Thalassemia minor     Family History  Problem Relation Age of Onset   Kidney disease Mother    Pancreatic cancer Father    Colon cancer Neg Hx    Esophageal cancer Neg Hx    Rectal cancer Neg Hx    Stomach cancer Neg Hx     Social History   Socioeconomic History   Marital status: Married    Spouse name: Zachary Everett   Number of children: 4   Years of education: Not on file   Highest education level: Not on file  Occupational History   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  All children are adults, and live independently, but within a mile of the patient.   Social Drivers of Corporate investment banker Strain: Low Risk  (02/07/2023)   Received from [facility], [facility]   Overall Financial  Resource Strain (CARDIA)    Difficulty of Paying Living Expenses: Not very hard  Food Insecurity: No Food Insecurity (02/07/2023)   Received from [facility], [facility]   Hunger Vital Sign    Worried About Running Out of Food in the Last Year: Never true    Ran Out of Food in the Last Year: Never true  Transportation Needs: No Transportation Needs (02/07/2023)   Received from [facility], [facility]   PRAPARE - Transportation    Lack of Transportation (Medical): No    Lack of Transportation (Non-Medical): No  Physical Activity: Insufficiently Active (02/07/2023)   Received from [facility], [facility]   Exercise Vital Sign    Days of Exercise per Week:  1 day    Minutes of Exercise per Session: 30 min  Stress: No Stress Concern Present (02/07/2023)   Received from [facility], [facility] - Occupational Stress Questionnaire    Feeling of Stress : Not at all  Social Connections: Socially Integrated (02/07/2023)   Received from [facility], [facility]   Social Network    How would you rate your social network (family, work, friends)?: Good participation with social networks  Intimate Partner Violence: Not At Risk (02/07/2023)   Received from [facility], [facility]   HITS    Over the last 12 months how often did your partner physically hurt you?: Never    Over the last 12 months how often did your partner insult you or talk down to you?: Never    Over the last 12 months how often did your partner threaten you with physical harm?: Never    Over the last 12 months how often did your partner scream or curse at you?: Never    Past Medical History, Surgical history, Social history, and Family history were reviewed and updated as appropriate.   Please see review of systems for further details on the patient's review from today.   Objective:   Physical Exam:  There were no vitals taken for this visit.  Physical  Exam Constitutional:      General: He is not in acute distress. Musculoskeletal:        General: No deformity.  Neurological:     Mental Status: He is alert and oriented to person, place, and time.     Coordination: Coordination normal.  Psychiatric:        Attention and Perception: Attention and perception normal. He does not perceive auditory or visual hallucinations.        Mood and Affect: Mood normal. Mood is not anxious or depressed. Affect is not labile, blunt, angry or inappropriate.        Speech: Speech normal.        Behavior: Behavior normal.        Thought Content: Thought content normal. Thought content is not paranoid or delusional. Thought content does not include homicidal or suicidal ideation. Thought content does not include homicidal or suicidal plan.        Cognition and Memory: Cognition and memory normal.        Judgment: Judgment normal.     Comments: Insight intact     Lab Review:     Component Value Date/Time   NA 141 11/01/2016 1346   K 3.9 11/01/2016 1346   CL 104 11/01/2016 1346   CO2 26 11/01/2016 1346   GLUCOSE 113 (H) 11/01/2016 1346   BUN 17 11/01/2016 1346   CREATININE 0.77 11/01/2016 1346   CALCIUM 9.4 11/01/2016 1346   PROT 8.2 (H) 11/01/2016 1346   ALBUMIN 4.5 11/01/2016 1346   AST 22 11/01/2016 1346   ALT 21 11/01/2016 1346   ALKPHOS 94 11/01/2016 1346   BILITOT 1.0 11/01/2016 1346   GFRNONAA >60 11/01/2016 1346   GFRAA >60 11/01/2016 1346       Component Value Date/Time   WBC 11.2 (H) 11/01/2016 1346   RBC 6.62 (H) 11/01/2016 1346   HGB 12.7 (L) 11/01/2016 1346   HCT 39.7 11/01/2016 1346   PLT 204 11/01/2016 1346   MCV 60.0 (L) 11/01/2016 1346   MCV 63.3 (A) 10/04/2013 1627   MCH 19.2 (L) 11/01/2016 1346   MCHC 32.0 11/01/2016  1346   RDW 16.9 (H) 11/01/2016 1346   LYMPHSABS 2.5 09/07/2015 1259   MONOABS 0.6 09/07/2015 1259   EOSABS 0.2 09/07/2015 1259   BASOSABS 0.1 09/07/2015 1259    No results found for: "POCLITH",  "LITHIUM"   No results found for: "PHENYTOIN", "PHENOBARB", "VALPROATE", "CBMZ"   .res Assessment: Plan:    Plan:  1. Lexapro  20mg  daily 2. Risperdal  3mg  at hs  3. Xanax  0.5mg  at hs - last filled 02/19/2023  Testosterone injections  RTC 6 months  25 minutes spent dedicated to the care of this patient on the date of this encounter to include pre-visit review of records, ordering of medication, post visit documentation, and face-to-face time with the patient discussing anxiety, depression, insomnia, and delusional disorder.. Discussed continuing current medication regimen.  Patient advised to contact office with any questions, adverse effects, or acute worsening in signs and symptoms.  Discussed potential benefits, risk, and side effects of benzodiazepines to include potential risk of tolerance and dependence, as well as possible drowsiness.  Advised patient not to drive if experiencing drowsiness and to take lowest possible effective dose to minimize risk of dependence and tolerance.  Discussed potential metabolic side effects associated with atypical antipsychotics, as well as potential risk for movement side effects. Advised pt to contact office if movement side effects occur.   There are no diagnoses linked to this encounter.  Please see After Visit Summary for patient specific instructions.  Future Appointments  Date Time Provider Department Center  03/13/2024  8:00 AM Olisa Quesnel Nattalie, NP [facility] None    No orders of the defined types were placed in this encounter.     -------------------------------

## 2024-07-01 ENCOUNTER — Other Ambulatory Visit: Payer: Self-pay | Admitting: Adult Health

## 2024-07-01 DIAGNOSIS — F411 Generalized anxiety disorder: Secondary | ICD-10-CM

## 2024-08-30 ENCOUNTER — Other Ambulatory Visit: Payer: Self-pay | Admitting: Adult Health

## 2024-08-30 DIAGNOSIS — F411 Generalized anxiety disorder: Secondary | ICD-10-CM

## 2024-09-01 NOTE — Telephone Encounter (Signed)
 Please call to schedule FU, due now.

## 2024-09-03 NOTE — Telephone Encounter (Signed)
Pt is scheduled 12/8

## 2024-09-10 ENCOUNTER — Encounter: Payer: Self-pay | Admitting: Adult Health

## 2024-09-10 ENCOUNTER — Ambulatory Visit: Admitting: Adult Health

## 2024-09-10 DIAGNOSIS — F419 Anxiety disorder, unspecified: Secondary | ICD-10-CM | POA: Diagnosis not present

## 2024-09-10 DIAGNOSIS — F32A Depression, unspecified: Secondary | ICD-10-CM | POA: Diagnosis not present

## 2024-09-10 DIAGNOSIS — F22 Delusional disorders: Secondary | ICD-10-CM

## 2024-09-10 DIAGNOSIS — F331 Major depressive disorder, recurrent, moderate: Secondary | ICD-10-CM

## 2024-09-10 DIAGNOSIS — G4700 Insomnia, unspecified: Secondary | ICD-10-CM | POA: Diagnosis not present

## 2024-09-10 DIAGNOSIS — F411 Generalized anxiety disorder: Secondary | ICD-10-CM

## 2024-09-10 MED ORDER — ALPRAZOLAM 0.5 MG PO TABS: ORAL_TABLET | ORAL | 2 refills | Status: AC

## 2024-09-10 NOTE — Progress Notes (Signed)
 Zachary Everett [id number] Mar 06, 1951 73 y.o.  Virtual Visit via Telephone Note  I connected with pt on 09/10/24 at  4:30 PM EST by telephone and verified that I am speaking with the correct person using two identifiers.   I discussed the limitations, risks, security and privacy concerns of performing an evaluation and management service by telephone and the availability of in person appointments. I also discussed with the patient that there may be a patient responsible charge related to this service. The patient expressed understanding and agreed to proceed.   I discussed the assessment and treatment plan with the patient. The patient was provided an opportunity to ask questions and all were answered. The patient agreed with the plan and demonstrated an understanding of the instructions.   The patient was advised to call back or seek an in-person evaluation if the symptoms worsen or if the condition fails to improve as anticipated.  I provided 20 minutes of non-face-to-face time during this encounter.  The patient was located at home.  The provider was located at [facility].   Angeline LOISE Sayers, NP   Subjective:   Patient ID:  Zachary Everett is a 73 y.o. (DOB 1951-07-10) male.  Chief Complaint: No chief complaint on file.   HPI Zachary Everett presents for follow-up of anxiety, depression, insomnia, and delusional disorder.  Describes mood today as ok. Pleasant. Mood symptoms - reports depression sometimes. Reports lower interest and motivation. Denies  irritability. Reports some anxiety about the future. Denies panic attacks. Reports some worry, rumination and over thinking. Denies negative thoughts. Reports mood is stable. Stating I feel like I'm doing ok. Taking medications as prescribed.   Energy levels lower. Active, does not have a regular exercise regimen.  Enjoys usual interests and activities. Married - lives with wife. Spending time with  family. Appetite adequate. Weight stable. Sleeps well most nights. Averages 5 to 6 hours.  Focus and concentration stable. Completing tasks. Managing aspects of household. Works full time. Denies SI or HI.  Denies AH or VH. Denies paranoia. Denies self harm. Denies substance use.  Review of Systems:  Review of Systems  Musculoskeletal:  Negative for gait problem.  Neurological:  Negative for tremors.  Psychiatric/Behavioral:         Please refer to HPI    Medications: I have reviewed the patient's current medications.  Current Outpatient Medications  Medication Sig Dispense Refill   ALPRAZolam  (XANAX ) 0.5 MG tablet TAKE 1 TABLET BY MOUTH EVERY NIGHT AT BEDTIME AS NEEDED FOR ANXIETY 30 tablet 0   aspirin-acetaminophen -caffeine (EXCEDRIN MIGRAINE) 250-250-65 MG tablet Take 2 tablets by mouth every 6 (six) hours as needed for headache.     atorvastatin (LIPITOR) 20 MG tablet TAKE 1 TABLET (20 MG TOTAL) BY MOUTH DAILY.     CALCIUM PO Take 2 tablets by mouth daily.     diclofenac  sodium (VOLTAREN ) 1 % GEL Apply 2 g topically 4 (four) times daily. 3 Tube 3   escitalopram  (LEXAPRO ) 20 MG tablet Take 1 tablet (20 mg total) by mouth daily. 90 tablet 3   GAVILYTE-N WITH FLAVOR PACK 420 g solution      MAGNESIUM PO Take 1 tablet by mouth daily.     risperiDONE  (RISPERDAL ) 3 MG tablet Take 1 tablet (3 mg total) by mouth at bedtime. 90 tablet 3   Testosterone (ANDROGEL PUMP) 20.25 MG/ACT (1.62%) GEL APPLY 4 PUMPS ONTO THE SKIN DAILY AS DIRECTED     Testosterone 12.5 MG/ACT (1%) GEL  traMADol  (ULTRAM ) 50 MG tablet Take 2 tablets (100 mg total) by mouth 4 (four) times daily. 240 tablet 5   No current facility-administered medications for this visit.    Medication Side Effects: None  Allergies:  Allergies  Allergen Reactions   Monosodium Glutamate Other (See Comments)   Other     MSG-migraine, swelling   Penicillins Rash    Has patient had a PCN reaction causing immediate rash,  facial/tongue/throat swelling, SOB or lightheadedness with hypotension: Yes Has patient had a PCN reaction causing severe rash involving mucus membranes or skin necrosis: Yes -all over body. Has patient had a PCN reaction that required hospitalization Pt was already in the hospital  Has patient had a PCN reaction occurring within the last 10 years: No If all of the above answers are NO, then may proceed with Cephalosporin use.     Past Medical History:  Diagnosis Date   Anxiety    Cataract    Chronic back pain    Depression    Hyperlipidemia    Lumbar spondylosis    Migraines    Phimosis    Postlaminectomy syndrome, lumbar region    Testosterone deficiency    Thalassemia minor     Family History  Problem Relation Age of Onset   Kidney disease Mother    Pancreatic cancer Father    Colon cancer Neg Hx    Esophageal cancer Neg Hx    Rectal cancer Neg Hx    Stomach cancer Neg Hx     Social History   Socioeconomic History   Marital status: Married    Spouse name: Rock   Number of children: 4   Years of education: Not on file   Highest education level: Not on file  Occupational History   Occupation: Midwife: [employer]    Comment: Owner  Tobacco Use   Smoking status: Former    Current packs/day: 0.00    Types: Cigarettes    Quit date: 02/23/1986    Years since quitting: 38.5   Smokeless tobacco: Never  Substance and Sexual Activity   Alcohol use: Yes    Alcohol/week: 3.0 standard drinks of alcohol    Types: 3 Glasses of wine per week   Drug use: Never   Sexual activity: Not on file  Other Topics Concern   Not on file  Social History Narrative   Lives with his wife.  All children are adults, and live independently, but within a mile of the patient.   Social Drivers of Corporate Investment Banker Strain: High Risk (05/16/2024)   Received from [facility]   Overall Financial Resource Strain (CARDIA)    How hard is it for you  to pay for the very basics like food, housing, medical care, and heating?: Very hard  Food Insecurity: No Food Insecurity (05/16/2024)   Received from [facility]   Hunger Vital Sign    Within the past 12 months, you worried that your food would run out before you got the money to buy more.: Never true    Within the past 12 months, the food you bought just didn't last and you didn't have money to get more.: Never true  Transportation Needs: No Transportation Needs (05/16/2024)   Received from [facility] - Transportation    In the past 12 months, has lack of transportation kept you from medical appointments or from getting medications?: No    In the past 12  months, has lack of transportation kept you from meetings, work, or from getting things needed for daily living?: No  Physical Activity: Sufficiently Active (05/16/2024)   Received from [facility]   Exercise Vital Sign    On average, how many days per week do you engage in moderate to strenuous exercise (like a brisk walk)?: 5 days    On average, how many minutes do you engage in exercise at this level?: 60 min  Stress: No Stress Concern Present (05/16/2024)   Received from [facility] - Occupational Stress Questionnaire    Do you feel stress - tense, restless, nervous, or anxious, or unable to sleep at night because your mind is troubled all the time - these days?: Not at all  Social Connections: Socially Integrated (05/16/2024)   Received from [facility]   Social Network    How would you rate your social network (family, work, friends)?: Good participation with social networks  Intimate Partner Violence: Not At Risk (05/16/2024)   Received from [facility]   HITS    Over the last 12 months how often did your partner physically hurt you?: Never    Over the last 12 months how often did your partner insult you or talk down to you?: Never    Over the last 12 months how often  did your partner threaten you with physical harm?: Never    Over the last 12 months how often did your partner scream or curse at you?: Never    Past Medical History, Surgical history, Social history, and Family history were reviewed and updated as appropriate.   Please see review of systems for further details on the patient's review from today.   Objective:   Physical Exam:  There were no vitals taken for this visit.  Physical Exam Constitutional:      General: He is not in acute distress. Musculoskeletal:        General: No deformity.  Neurological:     Mental Status: He is alert and oriented to person, place, and time.     Coordination: Coordination normal.  Psychiatric:        Attention and Perception: Attention and perception normal. He does not perceive auditory or visual hallucinations.        Mood and Affect: Mood normal. Mood is not anxious or depressed. Affect is not labile, blunt, angry or inappropriate.        Speech: Speech normal.        Behavior: Behavior normal.        Thought Content: Thought content normal. Thought content is not paranoid or delusional. Thought content does not include homicidal or suicidal ideation. Thought content does not include homicidal or suicidal plan.        Cognition and Memory: Cognition and memory normal.        Judgment: Judgment normal.     Comments: Insight intact     Lab Review:     Component Value Date/Time   NA 141 11/01/2016 1346   K 3.9 11/01/2016 1346   CL 104 11/01/2016 1346   CO2 26 11/01/2016 1346   GLUCOSE 113 (H) 11/01/2016 1346   BUN 17 11/01/2016 1346   CREATININE 0.77 11/01/2016 1346   CALCIUM 9.4 11/01/2016 1346   PROT 8.2 (H) 11/01/2016 1346   ALBUMIN 4.5 11/01/2016 1346   AST 22 11/01/2016 1346   ALT 21 11/01/2016 1346   ALKPHOS 94 11/01/2016 1346   BILITOT  1.0 11/01/2016 1346   GFRNONAA >60 11/01/2016 1346   GFRAA >60 11/01/2016 1346       Component Value Date/Time   WBC 11.2 (H) 11/01/2016  1346   RBC 6.62 (H) 11/01/2016 1346   HGB 12.7 (L) 11/01/2016 1346   HCT 39.7 11/01/2016 1346   PLT 204 11/01/2016 1346   MCV 60.0 (L) 11/01/2016 1346   MCV 63.3 (A) 10/04/2013 1627   MCH 19.2 (L) 11/01/2016 1346   MCHC 32.0 11/01/2016 1346   RDW 16.9 (H) 11/01/2016 1346   LYMPHSABS 2.5 09/07/2015 1259   MONOABS 0.6 09/07/2015 1259   EOSABS 0.2 09/07/2015 1259   BASOSABS 0.1 09/07/2015 1259    No results found for: POCLITH, LITHIUM   No results found for: PHENYTOIN, PHENOBARB, VALPROATE, CBMZ   .res Assessment: Plan:    Plan:  1. Lexapro  20mg  daily 2. Risperdal  3mg  at hs  3. Xanax  0.5mg  at hs - last filled 02/19/2023  Testosterone injections  RTC 6 months  20 minutes spent dedicated to the care of this patient on the date of this encounter to include pre-visit review of records, ordering of medication, post visit documentation, and face-to-face time with the patient discussing anxiety, depression, insomnia, and delusional disorder.. Discussed continuing current medication regimen.  Patient advised to contact office with any questions, adverse effects, or acute worsening in signs and symptoms.  Discussed potential benefits, risk, and side effects of benzodiazepines to include potential risk of tolerance and dependence, as well as possible drowsiness. Advised patient not to drive if experiencing drowsiness and to take lowest possible effective dose to minimize risk of dependence and tolerance.  Discussed potential metabolic side effects associated with atypical antipsychotics, as well as potential risk for movement side effects. Advised pt to contact office if movement side effects occur.   There are no diagnoses linked to this encounter.   Please see After Visit Summary for patient specific instructions.  Future Appointments  Date Time Provider Department Center  09/10/2024  4:30 PM Rodolfo Notaro Nattalie, NP [facility] None    No orders of the defined types  were placed in this encounter.     -------------------------------
